# Patient Record
Sex: Female | Born: 1970 | Race: Black or African American | Hispanic: No | Marital: Single | Smoking: Never smoker
Health system: Southern US, Community
[De-identification: ages and names within clinical notes are randomized; demographics above are authoritative.]

## PROBLEM LIST (undated history)

## (undated) DIAGNOSIS — O1490 Unspecified pre-eclampsia, unspecified trimester: Secondary | ICD-10-CM

## (undated) DIAGNOSIS — L509 Urticaria, unspecified: Secondary | ICD-10-CM

## (undated) DIAGNOSIS — K219 Gastro-esophageal reflux disease without esophagitis: Secondary | ICD-10-CM

## (undated) DIAGNOSIS — L309 Dermatitis, unspecified: Secondary | ICD-10-CM

## (undated) DIAGNOSIS — I10 Essential (primary) hypertension: Secondary | ICD-10-CM

## (undated) DIAGNOSIS — M1990 Unspecified osteoarthritis, unspecified site: Secondary | ICD-10-CM

## (undated) DIAGNOSIS — E7800 Pure hypercholesterolemia, unspecified: Secondary | ICD-10-CM

## (undated) DIAGNOSIS — D219 Benign neoplasm of connective and other soft tissue, unspecified: Secondary | ICD-10-CM

## (undated) DIAGNOSIS — R51 Headache: Secondary | ICD-10-CM

## (undated) HISTORY — PX: OTHER SURGICAL HISTORY: SHX169

## (undated) HISTORY — DX: Urticaria, unspecified: L50.9

## (undated) HISTORY — PX: WISDOM TOOTH EXTRACTION: SHX21

## (undated) HISTORY — DX: Dermatitis, unspecified: L30.9

---

## 2000-09-04 ENCOUNTER — Encounter: Admission: RE | Admit: 2000-09-04 | Discharge: 2000-09-25 | Payer: Self-pay | Admitting: Orthopedic Surgery

## 2000-11-04 ENCOUNTER — Encounter: Admission: RE | Admit: 2000-11-04 | Discharge: 2001-02-02 | Payer: Self-pay | Admitting: Anesthesiology

## 2000-12-24 ENCOUNTER — Emergency Department (HOSPITAL_COMMUNITY): Admission: EM | Admit: 2000-12-24 | Discharge: 2000-12-24 | Payer: Self-pay | Admitting: *Deleted

## 2001-04-23 ENCOUNTER — Encounter: Payer: Self-pay | Admitting: Emergency Medicine

## 2001-04-23 ENCOUNTER — Emergency Department (HOSPITAL_COMMUNITY): Admission: EM | Admit: 2001-04-23 | Discharge: 2001-04-23 | Payer: Self-pay | Admitting: Emergency Medicine

## 2002-08-26 ENCOUNTER — Encounter: Payer: Self-pay | Admitting: Physical Medicine and Rehabilitation

## 2002-08-26 ENCOUNTER — Encounter
Admission: RE | Admit: 2002-08-26 | Discharge: 2002-08-26 | Payer: Self-pay | Admitting: Physical Medicine and Rehabilitation

## 2003-03-24 ENCOUNTER — Encounter (INDEPENDENT_AMBULATORY_CARE_PROVIDER_SITE_OTHER): Payer: Self-pay | Admitting: Specialist

## 2003-03-24 ENCOUNTER — Encounter: Payer: Self-pay | Admitting: Orthopaedic Surgery

## 2003-03-24 ENCOUNTER — Ambulatory Visit (HOSPITAL_COMMUNITY): Admission: RE | Admit: 2003-03-24 | Discharge: 2003-03-24 | Payer: Self-pay | Admitting: Orthopaedic Surgery

## 2003-08-05 ENCOUNTER — Inpatient Hospital Stay (HOSPITAL_COMMUNITY): Admission: RE | Admit: 2003-08-05 | Discharge: 2003-08-08 | Payer: Self-pay | Admitting: Orthopaedic Surgery

## 2003-10-01 ENCOUNTER — Emergency Department (HOSPITAL_COMMUNITY): Admission: EM | Admit: 2003-10-01 | Discharge: 2003-10-01 | Payer: Self-pay | Admitting: Emergency Medicine

## 2004-04-22 ENCOUNTER — Emergency Department (HOSPITAL_COMMUNITY): Admission: EM | Admit: 2004-04-22 | Discharge: 2004-04-23 | Payer: Self-pay | Admitting: Emergency Medicine

## 2004-08-20 HISTORY — PX: SPINAL FUSION: SHX223

## 2008-03-15 ENCOUNTER — Emergency Department (HOSPITAL_COMMUNITY): Admission: EM | Admit: 2008-03-15 | Discharge: 2008-03-15 | Payer: Self-pay | Admitting: Emergency Medicine

## 2008-10-05 ENCOUNTER — Emergency Department (HOSPITAL_COMMUNITY): Admission: EM | Admit: 2008-10-05 | Discharge: 2008-10-05 | Payer: Self-pay | Admitting: Emergency Medicine

## 2009-11-09 ENCOUNTER — Emergency Department (HOSPITAL_COMMUNITY): Admission: EM | Admit: 2009-11-09 | Discharge: 2009-11-09 | Payer: Self-pay | Admitting: Family Medicine

## 2009-12-06 ENCOUNTER — Inpatient Hospital Stay (HOSPITAL_COMMUNITY): Admission: AD | Admit: 2009-12-06 | Discharge: 2009-12-07 | Payer: Self-pay | Admitting: Obstetrics & Gynecology

## 2009-12-10 ENCOUNTER — Inpatient Hospital Stay (HOSPITAL_COMMUNITY): Admission: AD | Admit: 2009-12-10 | Discharge: 2009-12-11 | Payer: Self-pay | Admitting: Obstetrics and Gynecology

## 2009-12-12 ENCOUNTER — Ambulatory Visit: Payer: Self-pay | Admitting: Physician Assistant

## 2009-12-12 ENCOUNTER — Inpatient Hospital Stay (HOSPITAL_COMMUNITY): Admission: AD | Admit: 2009-12-12 | Discharge: 2009-12-12 | Payer: Self-pay | Admitting: Obstetrics & Gynecology

## 2009-12-19 ENCOUNTER — Inpatient Hospital Stay (HOSPITAL_COMMUNITY): Admission: AD | Admit: 2009-12-19 | Discharge: 2009-12-19 | Payer: Self-pay | Admitting: Obstetrics & Gynecology

## 2010-04-27 ENCOUNTER — Inpatient Hospital Stay (HOSPITAL_COMMUNITY)
Admission: AD | Admit: 2010-04-27 | Discharge: 2010-05-06 | Payer: Self-pay | Source: Home / Self Care | Admitting: Obstetrics and Gynecology

## 2010-04-29 ENCOUNTER — Encounter (INDEPENDENT_AMBULATORY_CARE_PROVIDER_SITE_OTHER): Payer: Self-pay | Admitting: Obstetrics and Gynecology

## 2010-05-26 ENCOUNTER — Inpatient Hospital Stay (HOSPITAL_COMMUNITY): Admission: AD | Admit: 2010-05-26 | Discharge: 2010-05-27 | Payer: Self-pay | Admitting: Obstetrics and Gynecology

## 2010-08-03 ENCOUNTER — Inpatient Hospital Stay (HOSPITAL_COMMUNITY)
Admission: AD | Admit: 2010-08-03 | Discharge: 2010-08-03 | Payer: Self-pay | Source: Home / Self Care | Attending: Obstetrics and Gynecology | Admitting: Obstetrics and Gynecology

## 2010-10-30 LAB — URINALYSIS, ROUTINE W REFLEX MICROSCOPIC
Ketones, ur: 15 mg/dL — AB
Leukocytes, UA: NEGATIVE
Nitrite: NEGATIVE
Protein, ur: NEGATIVE mg/dL
Urobilinogen, UA: 1 mg/dL (ref 0.0–1.0)
pH: 6 (ref 5.0–8.0)

## 2010-11-02 LAB — COMPREHENSIVE METABOLIC PANEL
ALT: 10 U/L (ref 0–35)
ALT: 8 U/L (ref 0–35)
ALT: 9 U/L (ref 0–35)
AST: 121 U/L — ABNORMAL HIGH (ref 0–37)
AST: 148 U/L — ABNORMAL HIGH (ref 0–37)
AST: 21 U/L (ref 0–37)
AST: 22 U/L (ref 0–37)
AST: 35 U/L (ref 0–37)
Albumin: 2 g/dL — ABNORMAL LOW (ref 3.5–5.2)
Albumin: 1.8 g/dL — ABNORMAL LOW (ref 3.5–5.2)
Albumin: 2 g/dL — ABNORMAL LOW (ref 3.5–5.2)
Albumin: 2.3 g/dL — ABNORMAL LOW (ref 3.5–5.2)
Albumin: 2.4 g/dL — ABNORMAL LOW (ref 3.5–5.2)
Alkaline Phosphatase: 89 U/L (ref 39–117)
Alkaline Phosphatase: 93 U/L (ref 39–117)
Alkaline Phosphatase: 94 U/L (ref 39–117)
Alkaline Phosphatase: 98 U/L (ref 39–117)
BUN: 8 mg/dL (ref 6–23)
BUN: 9 mg/dL (ref 6–23)
CO2: 20 mEq/L (ref 19–32)
CO2: 20 mEq/L (ref 19–32)
CO2: 24 mEq/L (ref 19–32)
CO2: 26 mEq/L (ref 19–32)
Calcium: 6.3 mg/dL — CL (ref 8.4–10.5)
Calcium: 7.5 mg/dL — ABNORMAL LOW (ref 8.4–10.5)
Calcium: 8.2 mg/dL — ABNORMAL LOW (ref 8.4–10.5)
Calcium: 8.3 mg/dL — ABNORMAL LOW (ref 8.4–10.5)
Chloride: 106 mEq/L (ref 96–112)
Chloride: 102 mEq/L (ref 96–112)
Chloride: 105 mEq/L (ref 96–112)
Chloride: 107 mEq/L (ref 96–112)
Chloride: 107 mEq/L (ref 96–112)
Chloride: 111 mEq/L (ref 96–112)
Creatinine, Ser: 0.61 mg/dL (ref 0.4–1.2)
Creatinine, Ser: 0.65 mg/dL (ref 0.4–1.2)
Creatinine, Ser: 0.67 mg/dL (ref 0.4–1.2)
Creatinine, Ser: 0.89 mg/dL (ref 0.4–1.2)
GFR calc Af Amer: >60 mL/min (ref >60)
GFR calc Af Amer: >60 mL/min (ref >60)
GFR calc Af Amer: >60 mL/min (ref >60)
GFR calc Af Amer: >60 mL/min (ref >60)
GFR calc Af Amer: >60 mL/min (ref >60)
GFR calc non Af Amer: >60 mL/min (ref >60)
GFR calc non Af Amer: >60 mL/min (ref >60)
GFR calc non Af Amer: >60 mL/min (ref >60)
GFR calc non Af Amer: >60 mL/min (ref >60)
Glucose, Bld: 73 mg/dL (ref 70–99)
Glucose, Bld: 81 mg/dL (ref 70–99)
Glucose, Bld: 87 mg/dL (ref 70–99)
Potassium: 3.9 mEq/L (ref 3.5–5.1)
Potassium: 4 mEq/L (ref 3.5–5.1)
Potassium: 4.3 mEq/L (ref 3.5–5.1)
Sodium: 135 mEq/L (ref 135–145)
Sodium: 136 mEq/L (ref 135–145)
Sodium: 136 mEq/L (ref 135–145)
Total Bilirubin: 0.6 mg/dL (ref 0.3–1.2)
Total Bilirubin: 0.1 mg/dL — ABNORMAL LOW (ref 0.3–1.2)
Total Bilirubin: 0.2 mg/dL — ABNORMAL LOW (ref 0.3–1.2)
Total Bilirubin: 0.3 mg/dL (ref 0.3–1.2)
Total Bilirubin: 0.4 mg/dL (ref 0.3–1.2)
Total Bilirubin: 0.5 mg/dL (ref 0.3–1.2)
Total Protein: 5 g/dL — ABNORMAL LOW (ref 6.0–8.3)
Total Protein: 4.8 g/dL — ABNORMAL LOW (ref 6.0–8.3)
Total Protein: 5.1 g/dL — ABNORMAL LOW (ref 6.0–8.3)
Total Protein: 5.9 g/dL — ABNORMAL LOW (ref 6.0–8.3)

## 2010-11-02 LAB — URINALYSIS, MICROSCOPIC ONLY
Glucose, UA: NEGATIVE mg/dL
Ketones, ur: NEGATIVE mg/dL
Leukocytes, UA: NEGATIVE
Nitrite: NEGATIVE
Specific Gravity, Urine: 1.02 (ref 1.005–1.030)
pH: 7 (ref 5.0–8.0)

## 2010-11-02 LAB — CBC
HCT: 25.7 % — ABNORMAL LOW (ref 36.0–46.0)
HCT: 26.5 % — ABNORMAL LOW (ref 36.0–46.0)
HCT: 26.5 % — ABNORMAL LOW (ref 36.0–46.0)
HCT: 30.5 % — ABNORMAL LOW (ref 36.0–46.0)
Hemoglobin: 10.1 g/dL — ABNORMAL LOW (ref 12.0–15.0)
Hemoglobin: 8.6 g/dL — ABNORMAL LOW (ref 12.0–15.0)
Hemoglobin: 8.8 g/dL — ABNORMAL LOW (ref 12.0–15.0)
Hemoglobin: 8.9 g/dL — ABNORMAL LOW (ref 12.0–15.0)
MCH: 29.8 pg (ref 26.0–34.0)
MCH: 30 pg (ref 26.0–34.0)
MCH: 30.4 pg (ref 26.0–34.0)
MCH: 30.5 pg (ref 26.0–34.0)
MCH: 30.9 pg (ref 26.0–34.0)
MCHC: 32.4 g/dL (ref 30.0–36.0)
MCHC: 32.4 g/dL (ref 30.0–36.0)
MCHC: 33 g/dL (ref 30.0–36.0)
MCHC: 33.5 g/dL (ref 30.0–36.0)
MCHC: 33.8 g/dL (ref 30.0–36.0)
MCV: 91.2 fL (ref 78.0–100.0)
MCV: 90.8 fL (ref 78.0–100.0)
MCV: 92.1 fL (ref 78.0–100.0)
Platelets: 328 10*3/uL (ref 150–400)
Platelets: 159 10*3/uL (ref 150–400)
Platelets: 177 10*3/uL (ref 150–400)
Platelets: 201 10*3/uL (ref 150–400)
Platelets: 210 10*3/uL (ref 150–400)
RBC: 2.79 MIL/uL — ABNORMAL LOW (ref 3.87–5.11)
RBC: 2.85 MIL/uL — ABNORMAL LOW (ref 3.87–5.11)
RBC: 2.88 MIL/uL — ABNORMAL LOW (ref 3.87–5.11)
RBC: 3.36 MIL/uL — ABNORMAL LOW (ref 3.87–5.11)
RBC: 3.38 MIL/uL — ABNORMAL LOW (ref 3.87–5.11)
RDW: 13.4 % (ref 11.5–15.5)
RDW: 12.6 % (ref 11.5–15.5)
RDW: 12.6 % (ref 11.5–15.5)
RDW: 12.7 % (ref 11.5–15.5)
WBC: 7.8 10*3/uL (ref 4.0–10.5)
WBC: 16.4 10*3/uL — ABNORMAL HIGH (ref 4.0–10.5)
WBC: 7.4 10*3/uL (ref 4.0–10.5)
WBC: 8.9 10*3/uL (ref 4.0–10.5)

## 2010-11-02 LAB — URINALYSIS, ROUTINE W REFLEX MICROSCOPIC
Glucose, UA: NEGATIVE mg/dL
Glucose, UA: NEGATIVE mg/dL
Ketones, ur: 15 mg/dL — AB
Protein, ur: >300 mg/dL — AB
Specific Gravity, Urine: 1.025 (ref 1.005–1.030)
Urobilinogen, UA: 1 mg/dL (ref 0.0–1.0)
pH: 6 (ref 5.0–8.0)

## 2010-11-02 LAB — PROTEIN, URINE, 24 HOUR
Collection Interval-UPROT: 24 hours
Protein, Urine: 163 mg/dL
Urine Total Volume-UPROT: 2050 mL

## 2010-11-02 LAB — URINE MICROSCOPIC-ADD ON: WBC, UA: NONE SEEN WBC/hpf (ref <3)

## 2010-11-02 LAB — CREATININE CLEARANCE, URINE, 24 HOUR
Creatinine, 24H Ur: 1027 mg/d (ref 700–1800)
Creatinine: 0.71 mg/dL (ref 0.4–1.2)
Urine Total Volume-CRCL: 2050 mL

## 2010-11-02 LAB — URIC ACID
Uric Acid, Serum: 6.1 mg/dL (ref 2.4–7.0)
Uric Acid, Serum: 6.2 mg/dL (ref 2.4–7.0)

## 2010-11-02 LAB — MRSA PCR SCREENING: MRSA by PCR: POSITIVE — AB

## 2010-11-02 LAB — URINE CULTURE: Culture  Setup Time: 201109090059

## 2010-11-02 LAB — AST: AST: 80 U/L — ABNORMAL HIGH (ref 0–37)

## 2010-11-02 LAB — LACTATE DEHYDROGENASE
LDH: 180 U/L (ref 94–250)
LDH: 265 U/L — ABNORMAL HIGH (ref 94–250)
LDH: 410 U/L — ABNORMAL HIGH (ref 94–250)

## 2010-11-02 LAB — MAGNESIUM
Magnesium: 11.4 mg/dL (ref 1.5–2.5)
Magnesium: 6.4 mg/dL (ref 1.5–2.5)
Magnesium: 7.2 mg/dL (ref 1.5–2.5)

## 2010-11-02 LAB — DIFFERENTIAL
Basophils Absolute: 0 10*3/uL (ref 0.0–0.1)
Basophils Relative: 0 % (ref 0–1)
Eosinophils Relative: 2 % (ref 0–5)
Lymphocytes Relative: 23 % (ref 12–46)
Neutro Abs: 5.5 10*3/uL (ref 1.7–7.7)

## 2010-11-02 LAB — RPR: RPR Ser Ql: NONREACTIVE

## 2010-11-07 LAB — URINE MICROSCOPIC-ADD ON: WBC, UA: NONE SEEN WBC/hpf (ref <3)

## 2010-11-07 LAB — URINALYSIS, ROUTINE W REFLEX MICROSCOPIC
Bilirubin Urine: NEGATIVE
Bilirubin Urine: NEGATIVE
Glucose, UA: NEGATIVE mg/dL
Hgb urine dipstick: NEGATIVE
Ketones, ur: >80 mg/dL — AB
Ketones, ur: 40 mg/dL — AB
Ketones, ur: >80 mg/dL — AB
Ketones, ur: NEGATIVE mg/dL
Leukocytes, UA: NEGATIVE
Nitrite: NEGATIVE
Protein, ur: 30 mg/dL — AB
Protein, ur: NEGATIVE mg/dL
Specific Gravity, Urine: >1.03 — ABNORMAL HIGH (ref 1.005–1.030)
Urobilinogen, UA: 0.2 mg/dL (ref 0.0–1.0)
Urobilinogen, UA: 1 mg/dL (ref 0.0–1.0)
pH: 6 (ref 5.0–8.0)
pH: 6 (ref 5.0–8.0)

## 2010-11-07 LAB — COMPREHENSIVE METABOLIC PANEL
ALT: 12 U/L (ref 0–35)
ALT: 13 U/L (ref 0–35)
AST: 19 U/L (ref 0–37)
Albumin: 3.7 g/dL (ref 3.5–5.2)
CO2: 24 mEq/L (ref 19–32)
Calcium: 9.5 mg/dL (ref 8.4–10.5)
Calcium: 9.5 mg/dL (ref 8.4–10.5)
Chloride: 105 mEq/L (ref 96–112)
Creatinine, Ser: 0.64 mg/dL (ref 0.4–1.2)
GFR calc Af Amer: >60 mL/min (ref >60)
GFR calc non Af Amer: >60 mL/min (ref >60)
Glucose, Bld: 85 mg/dL (ref 70–99)
Sodium: 134 mEq/L — ABNORMAL LOW (ref 135–145)
Sodium: 136 mEq/L (ref 135–145)
Total Bilirubin: 0.6 mg/dL (ref 0.3–1.2)

## 2010-11-07 LAB — CBC
HCT: 38.9 % (ref 36.0–46.0)
Hemoglobin: 12.6 g/dL (ref 12.0–15.0)
MCHC: 32.4 g/dL (ref 30.0–36.0)
MCV: 89.5 fL (ref 78.0–100.0)
MCV: 89.6 fL (ref 78.0–100.0)
Platelets: 240 10*3/uL (ref 150–400)
RBC: 4.08 MIL/uL (ref 3.87–5.11)
RBC: 4.34 MIL/uL (ref 3.87–5.11)
WBC: 7.3 10*3/uL (ref 4.0–10.5)

## 2010-11-07 LAB — HCG, QUANTITATIVE, PREGNANCY: hCG, Beta Chain, Quant, S: 260699 m[IU]/mL — ABNORMAL HIGH (ref <5)

## 2010-11-07 LAB — WET PREP, GENITAL: Trich, Wet Prep: NONE SEEN

## 2010-12-24 ENCOUNTER — Ambulatory Visit (INDEPENDENT_AMBULATORY_CARE_PROVIDER_SITE_OTHER): Payer: Medicaid Other

## 2010-12-24 ENCOUNTER — Inpatient Hospital Stay (INDEPENDENT_AMBULATORY_CARE_PROVIDER_SITE_OTHER)
Admission: RE | Admit: 2010-12-24 | Discharge: 2010-12-24 | Disposition: A | Discharge status: Home / Self Care | Payer: Medicaid Other | Source: Ambulatory Visit | Attending: Family Medicine | Admitting: Family Medicine

## 2010-12-24 DIAGNOSIS — M79609 Pain in unspecified limb: Secondary | ICD-10-CM

## 2010-12-24 DIAGNOSIS — T148XXA Other injury of unspecified body region, initial encounter: Secondary | ICD-10-CM

## 2011-01-05 NOTE — H&P (Signed)
[name], [name]                           ACCOUNT NO.:  [id number]   MEDICAL RECORD NO.:  [id number]                   PATIENT TYPE:  OIB   LOCATION:  NA                                   FACILITY:  [facility]   PHYSICIAN:  Sharolyn Douglas, M.D.                     DATE OF BIRTH:  1970/10/25   DATE OF ADMISSION:  03/24/2003  DATE OF DISCHARGE:                                HISTORY & PHYSICAL   CHIEF COMPLAINT:  Back and right lower extremity pain.   HISTORY OF PRESENT ILLNESS:  The patient is a 40 year old female who has  been having back and right lower extremity pain that has been disabling and  eliminating her abilities to function.  She has tried conservative  treatments; unfortunately, has not gotten any relief.  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  The risks and  benefits of the proposed surgery were discussed with the patient by Dr. Sharolyn Douglas as well as myself; she indicated understanding and opted to proceed.   ALLERGIES:  No known drug allergies.   MEDICATIONS:  1. Celebrex daily.  2. Ibuprofen, which she has stopped taking.  3. Skelaxin p.r.n.  4. Allegra 180 mg daily.  5. Rhinocort nasal spray p.r.n.   PAST MEDICAL HISTORY:  Significant for:  1. Asthma.  2. Seasonal allergies.  3. Degenerative disk disease.   PAST SURGICAL HISTORY:  Oral surgery only.   SOCIAL HISTORY:  She denies tobacco use, drinks alcohol on a social basis.  She is single, has no children.  She lives alone.   FAMILY HISTORY:  Family medical history is noncontributory.   REVIEW OF SYSTEMS:  GENERAL:  The patient denies any fevers, chills, sweats,  or bleeding tendencies.  CNS:  Denies blurred vision, double vision,  seizure, headache,  or paralysis.  CARDIOVASCULAR:  No chest pain, angina,  orthopnea, claudication, or palpitations.  PULMONARY:  Denies shortness of  breath, productive cough, or hemoptysis.  GI:  Denies nausea, vomiting,  constipation, diarrhea, melena, or bloody stools.  GU:  Denies dysuria,  hematuria, or discharge.  MUSCULOSKELETAL:  As per HPI.   PHYSICAL EXAMINATION:  VITAL SIGNS:  Blood pressure 120/82, respirations 16  and unlabored, pulse is 78 and regular.  GENERAL APPEARANCE:  The patient is a 40 year old female who is alert and  oriented and in no acute distress.  She is well nourished, well groomed,  appears her stated age, pleasant and cooperative to exam.  HEENT:  Head is normocephalic, atraumatic.  Pupils are equal, round, and  reactive to light.  Extraocular movements are intact.  Nares are patent.  Pharynx is clear.  NECK:  Soft to palpation.  No lymphadenopathy, thyromegaly, or bruits are  appreciated.  CHEST:  Clear to auscultation bilaterally.  No rales, rhonchi, stridor,  wheezes, or friction rubs.  BREASTS:  Not pertinent and not performed.  HEART:  S1 and S2.  Regular rate and rhythm.  No murmurs, gallops, or rubs  are noted.  ABDOMEN:  Soft to palpation, nontender, nondistended.  No organomegaly is  noted.  Positive bowel sounds throughout.  GU:  Not pertinent and not performed.  EXTREMITIES:  Patient has right lower extremity pain.  Motor function and  sensation are grossly intact.  Pulses are intact and symmetric bilaterally.  SKIN:  Intact without any lesions or rashes.   LABORATORY DATA:  X-ray and MRI showed degenerative disk disease and HNP at  L5-S1.   IMPRESSION:  1. Herniated nucleus pulposus, L5-S1.  2. Degenerative disk disease.  3. Asthma.   PLAN:  Admit to [facility] on March 24, 2003, for percutaneous  diskectomy at L5-S1; this will be done by Dr. Sharolyn Douglas.      Verlin Fester, P.A.                       Sharolyn Douglas, M.D.    CM/MEDQ  D:   03/19/2003  T:  03/19/2003  Job:  [id number]

## 2011-01-05 NOTE — H&P (Signed)
[facility]  Patient:    [name], [name]                        MRN: [id number] Adm. Date:  [id number] Attending:  Thyra Breed CC:         Harvie Junior, M.D.  Lillia Dallas. Murray Hodgkins, M.D.   History and Physical  FOLLOW-UP EVALUATION:  [name] comes in for a follow-up evaluation today. The patient has had two lumbar epidural steroid injections with no improvement.  She saw Dr. Luiz Blare last week, and he has recommended that she see Dr. Murray Hodgkins.  She continues to have low back pain which radiates predominantly out to the right lower extremity with extension in the right buttock and the posterior aspect of the right lower extremity.  Her symptoms are unchanged, and she continues to have the same symptoms she presented with. She rates her pain at about 8 out of 10.  Her medications are Celebrex.  She denies any new numbness or tingling, new bowel or bladder problems, or new weakness.  PHYSICAL EXAMINATION:  VITAL SIGNS:  Blood pressure 120/82, heart rate 72, respiratory rate 20, O2 saturation 100%, pain level is 8 out of 10.  MUSCULOSKELETAL/NEUROLOGIC:  Straight leg raise signs are negative.  Deep tendon reflexes were symmetric at the knees and ankles.  Motor was 5/5.  IMPRESSION:  Low back pain syndrome with underlying degenerative disk disease at L5-S1 with herniated disk to the right.  DISPOSITION:  Increase the patients fluid and follow up with Dr. Murray Hodgkins and if his interventions are unsuccessful, she may need to consider surgical intervention. DD:  12/11/00 TD:  12/11/00 Job: [id number]

## 2011-01-05 NOTE — Procedures (Signed)
[facility]  Patient:    [name], [name]                        MRN: [id number] Proc. Date: 11/20/00 Adm. Date:  [id number] Attending:  Thyra Breed CC:         Harvie Junior, M.D.             Workmans Compensation                           Procedure Report  PROCEDURE:  Lumbar epidural steroid injection.  ANESTHESIOLOGIST:  Thyra Breed, M.D.  DIAGNOSIS:  Herniated disk at L5-S1.  HISTORY:  The patient is a 40 year old who is sent to [place] by Dr. Harvie Junior for epidural steroid injections.  The patient relates a history of an injury which occurred March 25, 2000.  She was initially evaluated at [facility] and treated conservatively with physical therapy with no lasting improvement.  She was later evaluated by Dr. Loraine Leriche C. Ophelia Charter, who felt like she should return to work.  She got another opinion from another physician that she does not recall, who treated her with a steroid dosepak which she stated temporarily helped her back discomfort. She was unable to get an MRI through that physician and was seen by Dr. Luiz Blare for evaluation initially on May 31, 2000.  At that time, the patient was given conservative management with physical therapy and Celebrex with Skelaxin.  She then got an FCE at that time.  She was seen back in followup and her medications adjusted.  Because her pain was persisting into her right buttock and down the posterior aspect of her right lower extremity, she underwent an MRI which was performed on October 02, 2000 and interpreted by ______ as showing degenerative disk disease at L5-S1 with a protrusion and slight posterior displacement of the S1 nerve root due to a herniated disk. She was seen back by Dr. Luiz Blare who had Dr. Katrinka Blazing do what sounds like a selective right S1 nerve root injection, which the patient stated exacerbated her pain.  She presents today for epidural steroid injections.  She describes her pain  as really a combination of several types of pain described as achy to sharp pins-and-needles-type discomfort.  It is made worse by standing or activities and improved by sitting and rest.  She initially described distribution into the anterior thighs and anterior legs, with some numbness and tingling over the soles of the feet.  It also affects her lower back to an extent.  She described nonfocal weakness of the lower extremities and no loss of control of her bowel or bladder.  She has not noted that the treatments with Celebrex and Skelaxin have resulted in a great deal of improvement, nor any sustained improvement from physical therapy.  CURRENT MEDICATIONS:  Skelaxin and Celebrex.  ALLERGIES:  None.  FAMILY HISTORY:  Family history is positive for diabetes, glaucoma, cancer, thyroid disease, allergic diathesis including asthma and rhinitis, history of renal failure with underlying hypertension and peptic ulcer disease and seizures.  ACTIVE MEDICAL PROBLEMS:  The patient has recurrent allergic problems, predominantly sinus problems and associated headaches.  PAST SURGICAL HISTORY:  Past surgical history is significant for wisdom tooth extraction.  SOCIAL HISTORY:  The patient rarely drinks alcohol.  She does not smoke.  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  REVIEW OF SYSTEMS:  GENERAL:  Negative.  HEAD:  Significant for headaches from her sinuses.  EYES:  Negative.  NOSE, MOUTH AND THROAT:  Recurrent allergic rhinitis.  EARS:  Negative.  PULMONARY:  Negative.  CARDIOVASCULAR:  Negative. GI:  History of some mild indigestion at times and constipation.  GU: Negative.  MUSCULOSKELETAL:  See HPI.  She also complains of some right shoulder discomfort and has had some physical therapy and injections for this. NEUROLOGIC:  See HPI for pertinent positives.  No history of seizure or stroke.  HEMATOLOGIC:  Negative.  CUTANEOUS:  Significant  for what sounds like eczematous-type eruptions related to her allergies.  ENDOCRINE:  Negative. PSYCHIATRIC:  Negative.  ALLERGY/IMMUNOLOGIC:  She states she is allergic to pollens as well as animal danders.  EXAMINATION:  VITAL SIGNS:  Blood pressure is 108/62, heart rate is 96, respiratory rate is 20, O2 saturation is 97%, pain level is 7/10 and temperature is 97.3.  GENERAL:  This is a small, thin female in no acute distress.  HEENT:  Head was normocephalic, atraumatic.  Eyes:  Extraocular movements intact, with conjunctivae and sclerae clear.  Nose:  Patent nares.  Oropharynx was free of lesions with some shallow torus palatinus.  NECK:  Neck demonstrated lymphadenopathy of the left-sided posterior cervical chain with good range of motion of the neck.  Carotids are 2+ and symmetric without bruits.  LUNGS:  Lungs are clear.  HEART:  Heart was regular rate and rhythm.  BREASTS, ABDOMINAL, PELVIC AND RECTAL:  Exams were not performed.  BACK:  Exam revealed mild scoliosis to the right which looked more spasmodic.  EXTREMITIES:  No cyanosis, clubbing, nor edema, with radial pulses and dorsalis pedis pulses 2+ and symmetric.  NEUROLOGIC:  The patient was oriented x 4.  Cranial nerves II-XII were grossly intact.  Deep tendon reflexes were symmetric in the upper and lower extremities with downgoing toes.  Motor was 5/5 with symmetric bulk and tone. Coordination was grossly intact.  Sensory was intact to pinprick and vibratory sense.  IMPRESSION: 1. Low back pain syndrome with history of trauma back in August of 2001, with    MRI showing degenerative disk disease at L5-S1 and herniated disk to the    right. 2. Other medical problems per primary care physician which include allergic    rhinitis with recurrent headaches.  DISPOSITION:  I discussed the potential risks, benefits and limitations of a lumbar epidural steroid injection in detail with the patient, as well as reviewed  the side-effects of corticosteroids.  Her questions were answered.  She wishes to proceed.  DESCRIPTION OF PROCEDURE:  After informed consent was obtained, the patient was placed in a sitting position and monitored.  Her back was prepped with Betadine x 3.  A skin wheal was raised at the L5-S1 interspace with 1% lidocaine.  A 20-gauge Tuohy needle was introduced to the lumbar epidural space to loss of resistance to preservative-free normal saline.  There was no CSF nor blood.  The depth was 3.5 cm.  I injected Medrol 40 mg in 6 ml of preservative-free normal saline.  The needle was flushed with preservative-free normal saline and removed intact.  POST-PROCEDURAL CONDITION:  Stable.  DISCHARGE INSTRUCTIONS: 1. Resume previous diet. 2. Limitation in activities per instruction sheet. 3. Continue on current medications. 4. Follow up with me in one week for a repeat injection.  The patient is    concerned about getting more than one injection, as she recently got an  injection by ______ and I advised her that we would just need to see how    she did with the injections to determine whether she needed further    injection. DD:  11/20/00 TD:  11/20/00 Job: [id number]

## 2011-01-05 NOTE — Op Note (Signed)
NAMEGWENEVERE, GOGA                           ACCOUNT NO.:  [id number]   MEDICAL RECORD NO.:  [id number]                   PATIENT TYPE:  INP   LOCATION:  5037                                 FACILITY:  [facility]   PHYSICIAN:  Sharolyn Douglas, M.D.                     DATE OF BIRTH:  October 21, 1970   DATE OF PROCEDURE:  08/05/2003  DATE OF DISCHARGE:                                 OPERATIVE REPORT   PREOPERATIVE DIAGNOSIS:  Degenerative disc disease L5-S1 with herniated  nucleus pulposus.   POSTOPERATIVE DIAGNOSIS:  Degenerative disc disease L5-S1 with herniated  nucleus pulposus.   PROCEDURE:  1. L5-S1 transforaminal lumbar interbody fusion with placement of a 10 mm     PEEK prosthetic cage packed with BMP and local autogenous bone graft.  2. Posterior spinal arthrodesis L5-S1.  3. Pedicle screw instrumentation, L5-S1, utilizing the [employer]     Encompass System.  4. Local autogenous bone graft supplemented with BMP.  5. Neural monitoring utilizing free running and triggered EMGs.   SURGEON:  Sharolyn Douglas, M.D.   ASSISTANT:  Verlin Fester, P.A.   ANESTHESIA:  General endotracheal anesthesia.   ESTIMATED BLOOD LOSS:  Minimal.   INDICATIONS FOR PROCEDURE:  The patient is a 40 year old female with a long  history of progressively worsening back and right leg pain.  She has had  various nonoperative treatment modalities including pain management,  injection, medications, and percutaneous discectomy at L5-S1.  Her plain  radiograph showed degenerative changes at L5-S1.  MRI scan shows disc  desiccation at L5-S1 with narrowing and HNP which is central and to the  right.  Because of her persistent symptoms refractory to all conservative  care, she has elected to undergo minimally invasive L5-S1 decompression and  fusion in hopes of improving her symptoms.  The risks, benefits, and  alternatives were discussed with the patient and she elected to proceed.   PROCEDURE:  The patient was  properly identified in the holding area, taken  to the operating room.  She underwent general endotracheal anesthesia  without difficulty.  She was given prophylactic IV antibiotics.  EMG leads  were placed for monitoring of triggered and free running EMGs utilizing the  NeuroVision System.  She was carefully turned prone onto the Wilson frame.  All bony prominences were padded.  The eyes were protected at all times.  The back was prepped and draped in the usual sterile fashion.  Fluoroscopy  was draped and brought into the field.  The lateral border of the L5 and S1  pedicles was marked bilaterally.  A 2.5 cm incision was made just lateral to  the pedicles bilaterally.  Dissection was carried sharply through the deep  fascia.  Starting on the right side, we used the dilators from the [facility]  retractor system to spread the paraspinal muscles.  The dilators were docked  onto the L5-S1  facet joint.  We then placed the Maxess retractor with the 50  mm blades.  The retractor was attached to the table.  We then expanded the  retractor exposing the facet joint on the right side.  We then performed a  facetectomy with the high speed bur and osteotomes.  The microscope was  draped and brought into the field.  The exiting L5 nerve root was  identified, gently mobilized medially.  We then identified the disc space.  Bleeding was controlled with bipolar electrocautery and Gelfoam.  The disc  space was incised.  We performed a radical discectomy across the  contralateral side using TLIF instruments.  We scraped the cartilaginous  endplates.  We dilated the disc space up to 10 mm.  We packed the disc space  with local autogenous bone graft obtained from the facet joint which was  cleaned and morselized.  We then placed a BMP sponge from the [employer] small  set into the disc space and pushed it anteriorly.  A 10 mm PEEK prosthetic  cage packed with half the second BMP sponge was then carefully impacted  into  the disc space and across the midline.  We confirmed good positioning with  AP and lateral fluoroscopy.   We then turned our attention to placing the pedicle screws.  Starting on the  right side, a Jamshidi needle was used to cannulate the pedicles.  We used  AP and lateral fluoroscopy as well as direct palpation of the L5 and S1  pedicles from within the spinal canal.  We placed guide-wires through the  Jamshidi needle.  We then placed cannulated pedicle screws over the guide-  wires.  A similar procedure was carried out on the left side, although we  were not able to palpate the pedicles from the canal.  We stimulated the  wires and tapped after each step and in each case, we had a response of less  than 14 milliamps consistent with interosseous placement.  We placed 6.5 by  40 mm screws at L5 and 6.5 by 35 mm screws on the sacrum.  We had good  purchase of the screws.  We stimulated the screws after they were placed  and, again, had a response of greater than 14 milliamps consistent with  interosseous placement of the screws.  We applied gentle compression across  the L5-S1 segment for locking the short segment titanium rods into position.  The locking caps were sheared off.   We then turned our attention to performing a posterior spinal arthrodesis on  the left side.  An osteotome was used to decorticate the L5-S1 facet joint.  We then packed the remaining BMP sponge and morselized local autogenous  graft over the facet joint.  The deep fascia was closed with an 0 Vicryl,  the subcutaneous layer closed with 2-0 Vicryl, followed by Dermabond.  The  patient was turned supine, extubated without difficulty, and transferred to  the recovery room in stable condition.  It should be noted there were no  changes in the free running EMGs throughout the TLIF procedure.                                               Sharolyn Douglas, M.D.   MC/MEDQ  D:  08/05/2003  T:  08/05/2003  Job:   [id number]

## 2011-01-05 NOTE — Op Note (Signed)
NAMEARDINE, IACOVELLI                           ACCOUNT NO.:  [id number]   MEDICAL RECORD NO.:  [id number]                   PATIENT TYPE:  OIB   LOCATION:  2550                                 FACILITY:  [facility]   PHYSICIAN:  Sharolyn Douglas, M.D.                     DATE OF BIRTH:  [birth year]   DATE OF PROCEDURE:  03/24/2003  DATE OF DISCHARGE:  03/24/2003                                 OPERATIVE REPORT   PREOPERATIVE DIAGNOSIS:  L5-S1 degenerative disk disease with herniated  nucleus pulposus and chronic back and leg pain.   POSTOPERATIVE DIAGNOSIS:  L5-S1 degenerative disk disease with herniated  nucleus pulposus and chronic back and leg pain.   OPERATION PERFORMED:  1. L5-S1 percutaneous diskectomy.  2. Radiologic interpretation of fluoroscopic images used for needle     localization.   SURGEON:  Sharolyn Douglas, M.D.   ASSISTANT:  None.   ANESTHESIA:  Conscious sedation plus local.   COMPLICATIONS:  None.   INDICATIONS FOR PROCEDURE:  The patient is a 40 year old female with a  several year history of disabling lower back and bilateral lower extremity  pain right greater than left.  She has failed all reasonable conservative  treatment options.  She has a large herniating disk at L5-S1 with concordant  pain at that level on diskography.  I had previously discussed with her the  possibility of L5-S1 fusion.  We also discussed percutaneous diskectomy.  At  this point she elected to try the percutaneous diskectomy in hopes of  improving her symptoms.  The risks, benefits and alternatives were  extensively reviewed with the patient and she elected to proceed.   DESCRIPTION OF PROCEDURE:  The patient was properly identified in the  holding area and taken to the operating room.  She underwent conscious  sedation by the anesthesia department.  She was carefully turned prone onto  the Muir table.  A bony prominences were padded.  She was given  prophylactic intravenous antibiotics.   The patient was prepped and draped in  the usual sterile fashion.  Fluoroscopy was brought into the field.  An  oblique image of L5-S1 was obtained.  2mL of 1% lidocaine with epinephrine  was used to anesthetize the skin and subcutaneous tissue.  A 16 gauge  introducer needle was then placed into the L5-S1 disk on the right side  using a posterolateral approach.  At no time did the patient experience any  leg pain.  A small amount of saline mixed with Ancef was injected into the  disk.  The patient experienced similar pain to her typical pain pattern.  The Stryker Dekompressor probe was then placed through the introducer needle  and turned on for approximately two minutes.  The probe was removed in and  out of the disk space under lateral fluoroscopy.  We collected a total of  2mL of disk material.  This was sent to pathology.  The Stryker Dekompressor  probe was then removed and 1mL of Ancef  solution was injected into the introducer needle as it was withdrawn from  the disk space.  A band-aid was placed over the small puncture hole.  The  patient was turned supine.  She was transferred to recovery room in stable  condition, able to remove all four extremities.                                                Sharolyn Douglas, M.D.    MC/MEDQ  D:  03/24/2003  T:  03/25/2003  Job:  [id number]

## 2011-01-05 NOTE — H&P (Signed)
[name], [name]                           ACCOUNT NO.:  [id number]   MEDICAL RECORD NO.:  [id number]                   PATIENT TYPE:  INP   LOCATION:  2860                                 FACILITY:  [facility]   PHYSICIAN:  Sharolyn Douglas, M.D.                     DATE OF BIRTH:  Sep 21, 1970   DATE OF ADMISSION:  08/05/2003  DATE OF DISCHARGE:                                HISTORY & PHYSICAL   CHIEF COMPLAINT:  Back and right lower extremity pain.   HISTORY OF PRESENT ILLNESS:  The patient is a 40 year old female who has  been having progressively worsening back and right lower extremity pain  secondary to a work injury dating back to August of 2001.  Unfortunately,  she has not seen any improvement with any conservative measures including  anti-inflammatories, narcotic pain medications, physical therapy, activity  modifications.  Pain is progressively getting worse, severe in nature, keeps  her up at night, limits her ability to work and interferes with activities  of daily living and quality of life .  She actually underwent a percutaneous  discectomy earlier this year and did not see any improvement from that  either.  Secondary to her severe pain and failure to improve as well as her  MRI and x-ray findings, it was felt that her best course of management would  be a posterior spinal fusion at L5-S1.  Risks and benefits of the procedure  were discussed with the patient by Dr. Noel Gerold as well as myself. She  indicated understanding and opted to proceed.   ALLERGIES:  No known drug allergies.   MEDICATIONS:  1. Claritin 10 mg daily.  2. Allegra 180 mg daily.  3. Nasacort two sprays daily.  4. Ibuprofen 800 mg three times a day.  5. Skelaxin p.r.n.   PAST MEDICAL HISTORY:  1. Migraine headaches.  2. Asthma.   PAST SURGICAL HISTORY:  Wisdom teeth extraction.   SOCIAL HISTORY:  The patient denies tobacco use.  She drinks alcohol on a  rare social bases.  She is single and works as  a Pharmacologist.  She  has her mother and friends in town to  help with her postoperative course.   FAMILY HISTORY:  Noncontributory.   REVIEW OF SYMPTOMS:  The patient denies fevers, chills, sweats or bleeding  tendencies.  CNS:  Denies blurry vision, double vision, seizures, headache,  paralysis.  CARDIOVASCULAR:  Denies chest pain, angina, orthopnea,  claudication or palpitation.  PULMONARY:  Denies shortness of breath,  productive cough or hemoptysis.  GI:  Denies nausea, vomiting, constipation,  diarrhea, melena or bloody stools.  GU:  Denies dysuria, hematuria or  discharge.  MUSCULOSKELETAL:  As per HPI.   PHYSICAL EXAMINATION:  GENERAL APPEARANCE:  The patient is a 40 year old  black female who is alert and oriented, in no acute distress.  She is well-  nourished,  well-groomed, appears stated age, pleasant and cooperative to  examination.  VITAL SIGNS:  Blood pressure 140/96, respiratory rate 16 and unlabored,  pulse 80 and regular.  HEENT:  Head is normocephalic and atraumatic.  Pupils are equal, round and  reactive.  Extraocular movements intact.  Nares patent.  Pharynx clear.  NECK:  Supple to palpation, no lymphadenopathy, thyromegaly.  No bruits  appreciated.  CHEST:  Clear to auscultation bilaterally.  No rales, rhonchi, stridor,  wheezes, or friction rubs.  BREASTS:  Not pertinent and not performed.  CARDIOVASCULAR:  S1 and S2 regular rate and rhythm with no murmurs, rubs, or  gallops noted.  ABDOMEN:  Soft to palpation.  Positive bowel sounds, nontender and  nondistended.  No organomegaly noted.  GU:  Not pertinent and not performed.  EXTREMITIES:  The patient has right lower extremity pain, motor and  sensation are grossly intact.  Pulses intact and symmetric.  Calves are soft  and nontender.  SKIN:  Intact without any lesions or rashes.   X-ray shows collapse of L5-S1 disc space.  MRI shows degenerative disc  disease at L5-S1 with an HNP at that level as  well.   IMPRESSION:  1. Degenerative disc disease with herniated nucleus pulposus at L5-S1.  2. Asthma.  3. History of migraines.   PLAN:  Admit to [facility] on August 05, 2003, for an  L5-S1 minimally invasive posterior spinal fusion.  This will be done by Dr.  Sharolyn Douglas.      Verlin Fester, P.A.                       Sharolyn Douglas, M.D.    CM/MEDQ  D:  08/05/2003  T:  08/05/2003  Job:  [id number]

## 2011-01-05 NOTE — Procedures (Signed)
[facility]  Patient:    [name], LAC                        MRN: [id number] Proc. Date: 12/02/00 Adm. Date:  [id number] Attending:  Thyra Breed CC:         Harvie Junior, M.D.  Workers Comp   Procedure Report  PROCEDURE PERFORMED:  Lumbar epidural steroid injection.  ANESTHESIOLOGIST:  Thyra Breed, M.D.  DIAGNOSIS:  Herniated nucleus pulposus at L5-S1.  INTERVAL HISTORY:  The patient has not noted a great deal of improvement after the first injection but wishes to proceed with another one today.  She notes no untoward effects.  PHYSICAL EXAMINATION:  Blood pressure 129/87, heart rate 103, respiratory rate 20, oxygen saturation 98%.  Her back shows good healing from her previous injection site.  DESCRIPTION OF PROCEDURE:  After informed consent was obtained, the patient was placed in sitting position and monitored.  The back was prepped with Betadine x 3.  A skin wheal was raised at the L4-5 interspace with 1% lidocaine.  A 20 gauge Tuohy needle was introduced into the lumbar epidural space to a loss of resistance to preservative free normal saline.  There was no CSF or blood.  The depth was 3.5 cm.  I injected 40 mg of Medrol in 6 ml of preservative free normal saline.  The needle was flushed with preservative free normal saline and removed intact.  Postprocedure condition was stable.  DISCHARGE INSTRUCTIONS: 1. Resume previous diet. 2. Limitations in activities per instruction sheet. 3. Continue on current medications. 4. The patient plans to see Dr. Luiz Blare later this week and she is scheduled    back next week for a third injection but I have advised her that she will    need to show signs that she is improving with this before proceeding. DD:  12/02/00 TD:  12/02/00 Job: [id number]

## 2011-01-05 NOTE — Discharge Summary (Signed)
NAMEJONESSA, TRIPLETT                           ACCOUNT NO.:  [id number]   MEDICAL RECORD NO.:  [id number]                   PATIENT TYPE:  INP   LOCATION:  5037                                 FACILITY:  [facility]   PHYSICIAN:  Sharolyn Douglas, M.D.                     DATE OF BIRTH:  [birth year]   DATE OF ADMISSION:  08/05/2003  DATE OF DISCHARGE:  08/08/2003                                 DISCHARGE SUMMARY   ADMISSION DIAGNOSES:  1. Lumbar degenerative disk disease.  2. Asthma.  3. History of migraines.   DISCHARGE DIAGNOSES:  1. Status post L5-S1 minimally invasive posterior spinal fusion.  2. Asthma.  3. History of migraines.   CONSULTS:  None.   PROCEDURES:  L5-S1 minimally invasive posterior spinal fusion with [place]  screws and TLIF at L5-S1.  Surgeon was Sharolyn Douglas, MD.  Assistant was [facility], PA-C.  General anesthesia.   LABORATORY DATA:  Labs preoperatively, CBC was within normal limits.  PT/INR, PTT within normal limits.  Complete metabolic panel within normal  limits with the exception of AST slightly elevated at 40.  UA was negative.  Blood typing was type B, Rh type positive, antibody screen negative.  UA  from December 13 was negative.   X-RAYS:  December 16 intraoperatively, C-arm was used for localization and  screw positioning.  December 19, postoperative changes at L5-S1 with  posterolateral interbody fusion.   BRIEF HISTORY:  The patient is a 40 year old female who has been having  progressively worsening back and right lower extremity pain secondary to a  work injury dating back to August of 2001. She denies any improvement with  years of conservative management including anti-inflammatories, narcotic  pain medications, physical therapy, activity modifications.  Pain is  progressively getting worse.  It is quite severe in nature, keeping her up  at night, limits her ability to work, and interferes with activities of  daily living, quality of life.  She had  undergone percutaneous diskectomy  earlier in the year and did not have any improvement from that.  She is  still having severe pain that is limiting her function, and she has  degenerative disk disease on MRI and x-ray.  It is felt that her best course  of management would be a posterior spinal fusion.  The risks and benefits of  this procedure were discussed with the patient by Dr. Noel Gerold.  She indicated  an understanding and opted to proceed.   HOSPITAL COURSE:  The patient was admitted to the hospital on August 05, 2003, for the above-listed procedure.  She tolerated the procedure well  without any intraoperative complications.  There was minimal blood loss  intraoperatively.  She was transferred to the recovery room in stable  condition.   Postoperatively routine orthopedic spine protocol was followed.  She  progressed very well.  Her primary difficulty was  with pain management.  We  did have her on initially  IV pain medicines as well as p.o. medicines.  She  was transitioned over to p.o. medicines strictly prior to discharge.  On  postoperative day 2 she was still having issues with pain management on p.o.  and she was also having some itching secondary to pain medicine.  Therefore,  she was started on Atarax which resolved her itching.  On postoperative day  3 the patient had met all orthopedic goals.  She was stable and ready for  discharge home medically.  [facility] was consulted for any home  needs.  Vital signs were stable.  Sensation was intact.  Incisions looked  good without any signs or symptoms of infection.   DISCHARGE PLAN:  The patient is a 40 year old female status post posterior  spinal fusion at L5-S1 doing well.  Activity is daily ambulation, brace on  when she is up.  Dressing changes daily to the back, keep incisions dry x5  days.  Back precautions were discussed and understood.  She is to avoid  lifting anything heavier than 5 pounds.  Follow up 2  weeks postoperatively  with Dr. Noel Gerold.  Diet is regular home diet.  Medications are Percocet,  Robaxin, calcium, multivitamin, and a laxative as needed.   CONDITION ON DISCHARGE:  Stable, improved.   DISPOSITION:  The patient is being discharged to her home with [facility] as well as family to assist her.      Verlin Fester, P.A.                       Sharolyn Douglas, M.D.    CM/MEDQ  D:  09/08/2003  T:  09/08/2003  Job:  [id number]

## 2011-08-29 ENCOUNTER — Other Ambulatory Visit: Payer: Self-pay | Admitting: Obstetrics and Gynecology

## 2011-09-03 ENCOUNTER — Encounter (HOSPITAL_COMMUNITY): Payer: Self-pay | Admitting: Pharmacist

## 2011-09-11 ENCOUNTER — Encounter (HOSPITAL_COMMUNITY): Payer: Self-pay

## 2011-09-11 ENCOUNTER — Encounter (HOSPITAL_COMMUNITY)
Admission: RE | Admit: 2011-09-11 | Discharge: 2011-09-11 | Disposition: A | Discharge status: Home / Self Care | Payer: Medicaid Other | Source: Ambulatory Visit | Attending: Obstetrics and Gynecology | Admitting: Obstetrics and Gynecology

## 2011-09-11 ENCOUNTER — Other Ambulatory Visit: Payer: Self-pay

## 2011-09-11 HISTORY — DX: Unspecified osteoarthritis, unspecified site: M19.90

## 2011-09-11 HISTORY — DX: Essential (primary) hypertension: I10

## 2011-09-11 HISTORY — DX: Gastro-esophageal reflux disease without esophagitis: K21.9

## 2011-09-11 HISTORY — DX: Benign neoplasm of connective and other soft tissue, unspecified: D21.9

## 2011-09-11 HISTORY — DX: Headache: R51

## 2011-09-11 LAB — CBC
MCH: 28.5 pg (ref 26.0–34.0)
MCV: 88.4 fL (ref 78.0–100.0)
Platelets: 208 10*3/uL (ref 150–400)
RDW: 13.2 % (ref 11.5–15.5)

## 2011-09-11 LAB — BASIC METABOLIC PANEL
Calcium: 9.2 mg/dL (ref 8.4–10.5)
Creatinine, Ser: 0.81 mg/dL (ref 0.50–1.10)
GFR calc Af Amer: >90 mL/min (ref >90)
GFR calc non Af Amer: 90 mL/min — ABNORMAL LOW (ref >90)
Sodium: 137 mEq/L (ref 135–145)

## 2011-09-11 NOTE — Patient Instructions (Addendum)
   Your procedure is scheduled ZO:XWRUEAV January 29th  Enter through the Main Entrance of [facility] at:9:30am Pick up the phone at the desk and dial [phone] and inform us of your arrival.  Please call this number if you have any problems the morning of surgery: [phone]  Remember: Do not eat food after midnight: Monday Do not drink clear liquids after: midnight Monday Take these medicines the morning of surgery with a SIP OF WATER: blood pressure medicines, bring inhaler with you  Do not wear jewelry, make-up, or FINGER nail polish Do not wear lotions, powders, perfumes or deodorant. Do not shave 48 hours prior to surgery. Do not bring valuables to the hospital.    Patients discharged on the day of surgery will not be allowed to drive home.     Remember to use your hibiclens as instructed.Please shower with 1/2 bottle the evening before your surgery and the other 1/2 bottle the morning of surgery.

## 2011-09-14 NOTE — H&P (Signed)
NAME:  [name], [name]               ACCOUNT NO.:  [id number]  MEDICAL RECORD NO.:  [id number]  LOCATION:  SDC                           FACILITY:  [facility]  PHYSICIAN:  Angela Roberts, M.D.   DATE OF BIRTH:  06/09/1971  DATE OF ADMISSION:  09/11/2011 DATE OF DISCHARGE:  09/11/2011                             HISTORY & PHYSICAL   HISTORY OF PRESENT ILLNESS:  [name] is a 40-year-old single black female, para 1-0-0-1, presenting for hysteroscopic fibroid resection and D and C because of a symptomatic submucosal fibroid, menorrhagia and dysmenorrhea.  During an evaluation for right lower quadrant pelvic pain, the patient underwent a pelvic ultrasound in December 2012.  That ultrasound revealed a uterus measuring 8.73 x 2.13 x 4.65 cm and a simple right ovarian cyst measuring 3.0 x 2.0 cm.  Additionally, she was noted to have a fundal fibroid measuring 1.1 x 1.1 x 1.3 cm, and a submucosal fibroid measuring 3.2 x 2.6 x 3.8 cm.  The patient reports a menstrual flow that will last for 7 days, requiring a pad change 3 times  a day.  She has significant pain with her menstrual periods, rating it at a 10/10 on a 10-point pain scale. Fortunately, however, she finds some relief with Tylenol with Codeine decreasing pain to 6/10 on a 10-point pain scale.  In recent days, the patient goes on to say she has had some some similar menstrual-like cramping almost daily; however, it is certainly much worse when she has her menstrual period.  Prior to August 21, 2011, the patient had not had a menstrual period since September 2013.(perhaps attributed to the Camila she was taking)  She denies any vaginitis symptoms, dysuria, dyspareunia, urinary frequency, or changes in her bowel movements.  Given the findings on pelvic ultrasound along with the patient's dysmenorrhea, she has decided to proceed with hysteroscopic resection of her submucosal fibroid.  PAST MEDICAL HISTORY:  OB HISTORY:  Gravida 1, para  1-0-0-1.  The patient had cesarean section in 2011.  She also has a history of severe preeclampsia.  GYN HISTORY:  Menarche 12-year-old.  Last period August 21, 2011.  She uses Camila progestin-only pills as her contraception.  Has a history of herpes simplex virus #2.  Has a remote history of an abnormal Pap smear. States, however, that it was simply repeated and since that time, it has been normal with her most recent normal being October 2012.  MEDICAL HISTORY:  Positive for asthma, degenerative disk disease, migraine headaches, hypertension, keloid formation, and cervical disk malalignment.  SURGICAL HISTORY:  In 2006, spinal fusion at L4-L5.  Denies any problems with anesthesia or history of blood transfusion.  FAMILY HISTORY:  Breast cancer, hypertension, migraines, stroke, liver disease, pancreatic cancer.  HABITS:  She does not use tobacco, alcohol, or illicit drugs.  SOCIAL HISTORY:  The patient is unemployed and is single.  CURRENT MEDICATIONS: 1. Labetalol 200 mg daily. 2. Hydrochlorothiazide 25 mg daily. 3. Singulair 10 mg daily. 4. Flonase 1 spray per nostril as needed. 5. K-Dur 20 mcg daily. 6. Ventolin inhaler 2 puffs every 6 hours as needed.  ALLERGIES:  The patient is allergic to MORPHINE and PERCOCET causing   itching.  ASPIRIN causes fever.  VICODIN and ROBAXIN caused severe nausea and vomiting.  SHELLFISH and BANANAS causes the patient's throat to swell.  She denies, however, any sensitivities to soy or peanuts and Latex(though there is a relationship between banana allergy and Latex reaction).    REVIEW OF SYSTEMS:  The patient does wear glasses.  She occasionally has shortness of breath due to her asthma.  Also has occasional headaches due to migraines.  Otherwise, no vision changes, chest pain, nausea, vomiting, diarrhea, myalgias, arthralgias, skin rashes, vaginitis symptoms, constipation, urinary frequency, urgency.  The patient does admit to  occasional right leg pain, and except as is otherwise mentioned in history of present illness, the Patient's review of systems is otherwise negative.  PHYSICAL EXAMINATION:  VITAL SIGNS:  Blood pressure 106/78, pulse is 74, respirations 16, temperature 98.2 degrees Fahrenheit orally, weight is 114 pounds, height 4 feet and 11 inches.  Body mass index is 23. NECK:  Supple without masses.  There is no thyromegaly or cervical adenopathy. HEART:  Regular rate and rhythm. LUNGS:  Clear. BACK:  No CVA tenderness. ABDOMEN:  With right lower quadrant tenderness, but no guarding or rebound.  There are no palpable masses or organomegaly. EXTREMITIES:  No clubbing, cyanosis, or edema. PELVIC:  EG BUS is normal.  Vagina is normal.  Cervix is nontender without lesions and it is displaced to the patient's right.  Uterus appears normal size, shape, and consistency without tenderness.  Adnexa; right adnexa is full with palpation and tender, though there is no discrete outline of a mass.  Left adnexa without tenderness or masses.  IMPRESSION: 1. Symptomatic submucosal fibroid. 2. Menorrhagia. 3. Dysmenorrhea.  DISPOSITION:  A discussion was held with the patient regarding indications for her procedure along with its risks which include but are not limited to reaction to anesthesia, damage to adjacent organs, bleeding, infection and the possibility that her pain may not resolve with this  procedure.  The patient verbalized understanding of these risk and has  consented to proceed with hysteroscopic resection of a submucosal fibroid followed by D and C with Dr. Roberts at [facility] on September 18, 2011, at 11 o'clock a.m.     Nicole Hafley J. Chrstopher Malenfant, P.A.-C   ______________________________ Angela Roberts, M.D.    EJP/MEDQ  D:  09/13/2011  T:  09/14/2011  Job:  [id number]  H&P reviewed No change in plan of care AYR 

## 2011-09-18 ENCOUNTER — Encounter (HOSPITAL_COMMUNITY): Payer: Self-pay | Admitting: Anesthesiology

## 2011-09-18 ENCOUNTER — Other Ambulatory Visit: Payer: Self-pay | Admitting: Obstetrics and Gynecology

## 2011-09-18 ENCOUNTER — Ambulatory Visit (HOSPITAL_COMMUNITY): Payer: Medicaid Other | Admitting: Anesthesiology

## 2011-09-18 ENCOUNTER — Encounter (HOSPITAL_COMMUNITY)
Admission: RE | Disposition: A | Discharge status: Home / Self Care | Payer: Self-pay | Source: Ambulatory Visit | Attending: Obstetrics and Gynecology

## 2011-09-18 ENCOUNTER — Ambulatory Visit (HOSPITAL_COMMUNITY)
Admission: RE | Admit: 2011-09-18 | Discharge: 2011-09-18 | Disposition: A | Discharge status: Home / Self Care | Payer: Medicaid Other | Source: Ambulatory Visit | Attending: Obstetrics and Gynecology | Admitting: Obstetrics and Gynecology

## 2011-09-18 DIAGNOSIS — D250 Submucous leiomyoma of uterus: Secondary | ICD-10-CM | POA: Insufficient documentation

## 2011-09-18 DIAGNOSIS — Z01818 Encounter for other preprocedural examination: Secondary | ICD-10-CM | POA: Insufficient documentation

## 2011-09-18 DIAGNOSIS — N946 Dysmenorrhea, unspecified: Secondary | ICD-10-CM | POA: Insufficient documentation

## 2011-09-18 DIAGNOSIS — Z01812 Encounter for preprocedural laboratory examination: Secondary | ICD-10-CM | POA: Insufficient documentation

## 2011-09-18 DIAGNOSIS — N83209 Unspecified ovarian cyst, unspecified side: Secondary | ICD-10-CM | POA: Insufficient documentation

## 2011-09-18 DIAGNOSIS — N920 Excessive and frequent menstruation with regular cycle: Secondary | ICD-10-CM | POA: Insufficient documentation

## 2011-09-18 LAB — SURGICAL PCR SCREEN: Staphylococcus aureus: NEGATIVE

## 2011-09-18 SURGERY — DILATATION & CURETTAGE/HYSTEROSCOPY WITH RESECTOCOPE
Anesthesia: General | Site: Uterus | Wound class: Clean Contaminated

## 2011-09-18 MED ORDER — GLYCINE 1.5 % IR SOLN
Status: DC | PRN
  Administered 2011-09-18: 3000 mL

## 2011-09-18 MED ORDER — FENTANYL CITRATE 0.05 MG/ML IJ SOLN
INTRAMUSCULAR | Status: DC | PRN
  Administered 2011-09-18 (×2): 50 ug via INTRAVENOUS

## 2011-09-18 MED ORDER — LIDOCAINE HCL (CARDIAC) 20 MG/ML IV SOLN
INTRAVENOUS | Status: AC
  Filled 2011-09-18: qty 5

## 2011-09-18 MED ORDER — MUPIROCIN 2 % EX OINT
TOPICAL_OINTMENT | CUTANEOUS | Status: AC
  Filled 2011-09-18: qty 22

## 2011-09-18 MED ORDER — ONDANSETRON HCL 4 MG/2ML IJ SOLN
INTRAMUSCULAR | Status: AC
  Filled 2011-09-18: qty 2

## 2011-09-18 MED ORDER — FENTANYL CITRATE 0.05 MG/ML IJ SOLN
INTRAMUSCULAR | Status: AC
  Administered 2011-09-18: 50 ug via INTRAVENOUS
  Filled 2011-09-18: qty 2

## 2011-09-18 MED ORDER — LIDOCAINE HCL 1 % IJ SOLN
INTRAMUSCULAR | Status: DC | PRN
  Administered 2011-09-18: 10 mL

## 2011-09-18 MED ORDER — PROPOFOL 10 MG/ML IV EMUL
INTRAVENOUS | Status: AC
  Filled 2011-09-18: qty 20

## 2011-09-18 MED ORDER — MIDAZOLAM HCL 2 MG/2ML IJ SOLN
INTRAMUSCULAR | Status: AC
  Filled 2011-09-18: qty 2

## 2011-09-18 MED ORDER — HYDROMORPHONE HCL 2 MG PO TABS: 2.0000 mg | ORAL_TABLET | ORAL | Status: AC | PRN

## 2011-09-18 MED ORDER — MIDAZOLAM HCL 5 MG/5ML IJ SOLN
INTRAMUSCULAR | Status: DC | PRN
  Administered 2011-09-18: 1 mg via INTRAVENOUS

## 2011-09-18 MED ORDER — LACTATED RINGERS IV SOLN
INTRAVENOUS | Status: DC
  Administered 2011-09-18 (×2): via INTRAVENOUS

## 2011-09-18 MED ORDER — LIDOCAINE HCL (CARDIAC) 20 MG/ML IV SOLN
INTRAVENOUS | Status: DC | PRN
  Administered 2011-09-18: 80 mg via INTRAVENOUS

## 2011-09-18 MED ORDER — FENTANYL CITRATE 0.05 MG/ML IJ SOLN
25.0000 ug | INTRAMUSCULAR | Status: DC | PRN
  Administered 2011-09-18: 50 ug via INTRAVENOUS

## 2011-09-18 MED ORDER — KETOROLAC TROMETHAMINE 30 MG/ML IJ SOLN: 15.0000 mg | Freq: Once | INTRAMUSCULAR | Status: DC | PRN

## 2011-09-18 MED ORDER — KETOROLAC TROMETHAMINE 30 MG/ML IJ SOLN
INTRAMUSCULAR | Status: DC | PRN
  Administered 2011-09-18: 30 mg via INTRAVENOUS

## 2011-09-18 MED ORDER — IBUPROFEN 600 MG PO TABS: 600.0000 mg | ORAL_TABLET | Freq: Four times a day (QID) | ORAL | Status: AC | PRN

## 2011-09-18 MED ORDER — KETOROLAC TROMETHAMINE 30 MG/ML IJ SOLN
INTRAMUSCULAR | Status: AC
  Filled 2011-09-18: qty 1

## 2011-09-18 MED ORDER — FENTANYL CITRATE 0.05 MG/ML IJ SOLN
INTRAMUSCULAR | Status: AC
  Filled 2011-09-18: qty 4

## 2011-09-18 MED ORDER — DEXAMETHASONE SODIUM PHOSPHATE 10 MG/ML IJ SOLN
INTRAMUSCULAR | Status: AC
  Filled 2011-09-18: qty 1

## 2011-09-18 MED ORDER — PROPOFOL 10 MG/ML IV EMUL
INTRAVENOUS | Status: DC | PRN
  Administered 2011-09-18: 150 mg via INTRAVENOUS

## 2011-09-18 SURGICAL SUPPLY — 15 items
CANISTER SUCTION 2500CC (MISCELLANEOUS) ×2 IMPLANT
CATH ROBINSON RED A/P 16FR (CATHETERS) ×2 IMPLANT
CLOTH BEACON ORANGE TIMEOUT ST (SAFETY) ×2 IMPLANT
CONTAINER PREFILL 10% NBF 60ML (FORM) ×4 IMPLANT
ELECT REM PT RETURN 9FT ADLT (ELECTROSURGICAL) ×2
ELECTRODE REM PT RTRN 9FT ADLT (ELECTROSURGICAL) ×1 IMPLANT
GLOVE BIO SURGEON STRL SZ7.5 (GLOVE) ×4 IMPLANT
GLOVE BIOGEL PI IND STRL 7.5 (GLOVE) ×1 IMPLANT
GLOVE BIOGEL PI INDICATOR 7.5 (GLOVE) ×1
GOWN PREVENTION PLUS LG XLONG (DISPOSABLE) ×3 IMPLANT
GOWN STRL REIN XL XLG (GOWN DISPOSABLE) ×2 IMPLANT
LOOP ANGLED CUTTING 22FR (CUTTING LOOP) ×1 IMPLANT
PACK HYSTEROSCOPY LF (CUSTOM PROCEDURE TRAY) ×2 IMPLANT
TOWEL OR 17X24 6PK STRL BLUE (TOWEL DISPOSABLE) ×4 IMPLANT
WATER STERILE IRR 1000ML POUR (IV SOLUTION) ×2 IMPLANT

## 2011-09-18 NOTE — Anesthesia Preprocedure Evaluation (Signed)
Anesthesia Evaluation  Patient identified by MRN, date of birth, ID band Patient awake    Reviewed: Allergy & Precautions, H&P , NPO status , Patient's Chart, lab work & pertinent test results, reviewed documented beta blocker date and time   History of Anesthesia Complications Negative for: history of anesthetic complications  Airway Mallampati: I TM Distance: >3 FB Neck ROM: limited   Comment: Neck ROM limited due to pain.  Patient reports she has a disc problem that is being evaluated by ortho Dental  (+) Teeth Intact   Pulmonary asthma (last inhaler use 2-3 months ago) ,  clear to auscultation  Pulmonary exam normal       Cardiovascular Exercise Tolerance: Good hypertension (well controlled on labetalol and HCTZ), On Home Beta Blockers and On Medications regular Normal    Neuro/Psych  Headaches (frequent headaches - some migraines, some sinus), Negative Psych ROS   GI/Hepatic Neg liver ROS, GERD- (taking her zantac now)  Medicated,  Endo/Other  Negative Endocrine ROS  Renal/GU negative Renal ROS  Female GU complaint     Musculoskeletal  (+) Arthritis - (DDD, has had lumbar fusion),   Abdominal   Peds  Hematology negative hematology ROS (+)   Anesthesia Other Findings Allergic to ASA, but can take ibuprofen Allergic to shrimp, but can eat all other shellfish  Reproductive/Obstetrics negative OB ROS                           Anesthesia Physical Anesthesia Plan  ASA: II  Anesthesia Plan: General LMA   Post-op Pain Management:    Induction:   Airway Management Planned:   Additional Equipment:   Intra-op Plan:   Post-operative Plan:   Informed Consent: I have reviewed the patients History and Physical, chart, labs and discussed the procedure including the risks, benefits and alternatives for the proposed anesthesia with the patient or authorized representative who has indicated  his/her understanding and acceptance.   Dental Advisory Given  Plan Discussed with: CRNA and Surgeon  Anesthesia Plan Comments:         Anesthesia Quick Evaluation

## 2011-09-18 NOTE — Preoperative (Signed)
Beta Blockers   Reason not to administer Beta Blockers:Hold beta blocker due to other, patient took scheduled medication at home this a.m.

## 2011-09-18 NOTE — H&P (Signed)
NAMEJASEMINE, NAWAZ NO.:  [id number]  MEDICAL RECORD NO.:  [id number]  LOCATION:  [facility]                           FACILITY:  [facility]  PHYSICIAN:  Osborn Coho, M.D.   DATE OF BIRTH:  [birth year]  DATE OF ADMISSION:  09/11/2011 DATE OF DISCHARGE:  09/11/2011                             HISTORY & PHYSICAL   HISTORY OF PRESENT ILLNESS:  Ms. Bethune is a 41 year old single black female, para 1-0-0-1, presenting for hysteroscopic fibroid resection and D and C because of a symptomatic submucosal fibroid, menorrhagia and dysmenorrhea.  During an evaluation for right lower quadrant pelvic pain, the patient underwent a pelvic ultrasound in December 2012.  That ultrasound revealed a uterus measuring 8.73 x 2.13 x 4.65 cm and a simple right ovarian cyst measuring 3.0 x 2.0 cm.  Additionally, she was noted to have a fundal fibroid measuring 1.1 x 1.1 x 1.3 cm, and a submucosal fibroid measuring 3.2 x 2.6 x 3.8 cm.  The patient reports a menstrual flow that will last for 7 days, requiring a pad change 3 times  a day.  She has significant pain with her menstrual periods, rating it at a 10/10 on a 10-point pain scale. Fortunately, however, she finds some relief with Tylenol with Codeine decreasing pain to 6/10 on a 10-point pain scale.  In recent days, the patient goes on to say she has had some some similar menstrual-like cramping almost daily; however, it is certainly much worse when she has her menstrual period.  Prior to August 21, 2011, the patient had not had a menstrual period since September 2013.(perhaps attributed to the Camila she was taking)  She denies any vaginitis symptoms, dysuria, dyspareunia, urinary frequency, or changes in her bowel movements.  Given the findings on pelvic ultrasound along with the patient's dysmenorrhea, she has decided to proceed with hysteroscopic resection of her submucosal fibroid.  PAST MEDICAL HISTORY:  OB HISTORY:  Gravida 1, para  1-0-0-1.  The patient had cesarean section in 2011.  She also has a history of severe preeclampsia.  GYN HISTORY:  Menarche 41 year old.  Last period August 21, 2011.  She uses Camila progestin-only pills as her contraception.  Has a history of herpes simplex virus #2.  Has a remote history of an abnormal Pap smear. States, however, that it was simply repeated and since that time, it has been normal with her most recent normal being October 2012.  MEDICAL HISTORY:  Positive for asthma, degenerative disk disease, migraine headaches, hypertension, keloid formation, and cervical disk malalignment.  SURGICAL HISTORY:  In 2006, spinal fusion at L4-L5.  Denies any problems with anesthesia or history of blood transfusion.  FAMILY HISTORY:  Breast cancer, hypertension, migraines, stroke, liver disease, pancreatic cancer.  HABITS:  She does not use tobacco, alcohol, or illicit drugs.  SOCIAL HISTORY:  The patient is unemployed and is single.  CURRENT MEDICATIONS: 1. Labetalol 200 mg daily. 2. Hydrochlorothiazide 25 mg daily. 3. Singulair 10 mg daily. 4. Flonase 1 spray per nostril as needed. 5. K-Dur 20 mcg daily. 6. Ventolin inhaler 2 puffs every 6 hours as needed.  ALLERGIES:  The patient is allergic to MORPHINE and PERCOCET causing  itching.  ASPIRIN causes fever.  VICODIN and ROBAXIN caused severe nausea and vomiting.  SHELLFISH and BANANAS causes the patient's throat to swell.  She denies, however, any sensitivities to soy or peanuts and Latex(though there is a relationship between banana allergy and Latex reaction).    REVIEW OF SYSTEMS:  The patient does wear glasses.  She occasionally has shortness of breath due to her asthma.  Also has occasional headaches due to migraines.  Otherwise, no vision changes, chest pain, nausea, vomiting, diarrhea, myalgias, arthralgias, skin rashes, vaginitis symptoms, constipation, urinary frequency, urgency.  The patient does admit to  occasional right leg pain, and except as is otherwise mentioned in history of present illness, the Patient's review of systems is otherwise negative.  PHYSICAL EXAMINATION:  VITAL SIGNS:  Blood pressure 106/78, pulse is 74, respirations 16, temperature 98.2 degrees Fahrenheit orally, weight is 114 pounds, height 4 feet and 11 inches.  Body mass index is 23. NECK:  Supple without masses.  There is no thyromegaly or cervical adenopathy. HEART:  Regular rate and rhythm. LUNGS:  Clear. BACK:  No CVA tenderness. ABDOMEN:  With right lower quadrant tenderness, but no guarding or rebound.  There are no palpable masses or organomegaly. EXTREMITIES:  No clubbing, cyanosis, or edema. PELVIC:  EG BUS is normal.  Vagina is normal.  Cervix is nontender without lesions and it is displaced to the patient's right.  Uterus appears normal size, shape, and consistency without tenderness.  Adnexa; right adnexa is full with palpation and tender, though there is no discrete outline of a mass.  Left adnexa without tenderness or masses.  IMPRESSION: 1. Symptomatic submucosal fibroid. 2. Menorrhagia. 3. Dysmenorrhea.  DISPOSITION:  A discussion was held with the patient regarding indications for her procedure along with its risks which include but are not limited to reaction to anesthesia, damage to adjacent organs, bleeding, infection and the possibility that her pain may not resolve with this  procedure.  The patient verbalized understanding of these risk and has  consented to proceed with hysteroscopic resection of a submucosal fibroid followed by D and C with Dr. Su Hilt at [facility] on September 18, 2011, at 11 o'clock a.m.     Elmira J. Lowell Guitar, P.A.-C   ______________________________ Osborn Coho, M.D.    EJP/MEDQ  D:  09/13/2011  T:  09/14/2011  Job:  [id number]  H&P reviewed No change in plan of care AYR

## 2011-09-18 NOTE — Anesthesia Postprocedure Evaluation (Signed)
Anesthesia Post Note  Patient: [name]  Procedure(s) Performed:  DILATATION & CURETTAGE/HYSTEROSCOPY WITH RESECTOCOPE  Anesthesia type: GA  Patient location: PACU  Post pain: Pain level controlled  Post assessment: Post-op Vital signs reviewed  Last Vitals:  Filed Vitals:   09/18/11 1230  BP: 119/73  Pulse: 71  Temp:   Resp: 40    Post vital signs: Reviewed  Level of consciousness: sedated  Complications: No apparent anesthesia complications

## 2011-09-18 NOTE — Op Note (Signed)
Preop Diagnosis: Submucosal Fibroid, menorrhagia, Dysmenorrhea   Postop Diagnosis: Submucosal Fibroid, menorrhagia, Dysmenorrhea   Procedure: HYSTEROSCOPY WITH RESECTOSCOPE   Anesthesia: General   Anesthesiologist: Velna Hatchet, MD   Attending: Purcell Nails, MD   Assistant: N/a  Findings: Appearance of a possible posterior wall submucosal fibroid  Pathology: 1.endometrial curettings 2.portions of resected fibroid  Fluids: 1000cc  UOP: Quantity sufficient via straight cath prior to procedure  EBL: Minimal  Complications: None  Procedure: The patient was taken to the operating room after the risks, benefits and alternatives were discussed with the patient. The patient verbalized understanding and consent signed and witnessed. The patient was placed under general anesthesia with an LMA per anesthesiologist and prepped and draped in the normal sterile fashion.  Time Out was performed per protocol.  A bivalve speculum was placed in the patient's vagina and the anterior lip of the cervix was grasped with a single tooth tenaculum. A paracervical block was administered using a total of 10 cc of 1% lidocaine. The uterus sounded to 8cm cm. The cervix was dilated for passage of the hysteroscope.  The hysteroscope was introduced into the uterine cavity and findings as noted above. Sharp curettage was performed until a gritty texture was noted and curettings were sent to pathology.   The resectoscope was introduced and the area in the posterior wall suspicious for a submucosal fibroid was resected.  Curettage was performed once again.  The hysteroscope was reintroduced and no obvious remaining intracavitary lesions were noted.  All instruments were removed. Sponge lap and needle count was correct. The patient tolerated the procedure well and was returned to the recovery room in good condition.

## 2011-09-18 NOTE — Transfer of Care (Signed)
Immediate Anesthesia Transfer of Care Note  Patient: [name]  Procedure(s) Performed:  DILATATION & CURETTAGE/HYSTEROSCOPY WITH RESECTOCOPE  Patient Location: PACU  Anesthesia Type: General  Level of Consciousness: awake, alert , oriented and patient cooperative  Airway & Oxygen Therapy: Patient Spontanous Breathing and Patient connected to nasal cannula oxygen  Post-op Assessment: Report given to PACU RN and Post -op Vital signs reviewed and stable  Post vital signs: Reviewed and stable  Complications: No apparent anesthesia complications

## 2011-10-31 ENCOUNTER — Other Ambulatory Visit: Payer: Self-pay | Admitting: Gastroenterology

## 2011-11-07 ENCOUNTER — Ambulatory Visit
Admission: RE | Admit: 2011-11-07 | Discharge: 2011-11-07 | Disposition: A | Discharge status: Home / Self Care | Payer: Medicaid Other | Source: Ambulatory Visit | Attending: Gastroenterology | Admitting: Gastroenterology

## 2012-02-07 ENCOUNTER — Other Ambulatory Visit: Payer: Self-pay | Admitting: Obstetrics and Gynecology

## 2012-02-08 ENCOUNTER — Telehealth: Payer: Self-pay

## 2012-02-08 NOTE — Telephone Encounter (Signed)
Returned call to pt regarding [facility] refill. Pt not having any problems at this time and no skipped dosages. Pt not due for aex until 05/2012. RX was call to [facility] on [address] for Camilla 0.35mg  #1 with 4 refills. [name]

## 2012-02-29 ENCOUNTER — Other Ambulatory Visit: Payer: Self-pay | Admitting: Obstetrics and Gynecology

## 2012-03-04 ENCOUNTER — Telehealth: Payer: Self-pay

## 2012-03-04 NOTE — Telephone Encounter (Signed)
Per AR, 1 RF of T#3 #30 sig 1-2 po q 4-6 hrs prn pain 0 RF's. Called to [facility] pharmacy. JO, CMA

## 2012-03-05 DIAGNOSIS — J45909 Unspecified asthma, uncomplicated: Secondary | ICD-10-CM | POA: Insufficient documentation

## 2012-03-05 DIAGNOSIS — J302 Other seasonal allergic rhinitis: Secondary | ICD-10-CM | POA: Insufficient documentation

## 2012-03-05 NOTE — Telephone Encounter (Signed)
Ar pt 

## 2012-03-12 DIAGNOSIS — I10 Essential (primary) hypertension: Secondary | ICD-10-CM | POA: Insufficient documentation

## 2012-03-12 DIAGNOSIS — L819 Disorder of pigmentation, unspecified: Secondary | ICD-10-CM | POA: Insufficient documentation

## 2012-03-13 ENCOUNTER — Telehealth: Payer: Self-pay

## 2012-04-09 ENCOUNTER — Telehealth: Payer: Self-pay

## 2012-04-09 NOTE — Telephone Encounter (Signed)
Spoke to [employer] at [facility]. Denied RF for T#3. Pt needs re-eval for continued need for this med. We just filled it last month. Pharmacy will tell pt she needs to make appt. Melody Comas A

## 2012-06-12 ENCOUNTER — Other Ambulatory Visit: Payer: Self-pay | Admitting: Obstetrics and Gynecology

## 2012-06-18 ENCOUNTER — Ambulatory Visit: Payer: Self-pay | Admitting: Obstetrics and Gynecology

## 2012-06-23 NOTE — Telephone Encounter (Signed)
Note to close encounter only. [name], Jacqueline A  

## 2012-06-29 ENCOUNTER — Other Ambulatory Visit (HOSPITAL_COMMUNITY): Payer: Self-pay | Admitting: Obstetrics and Gynecology

## 2012-07-23 ENCOUNTER — Encounter (HOSPITAL_COMMUNITY): Payer: Self-pay | Admitting: *Deleted

## 2012-07-23 ENCOUNTER — Emergency Department (INDEPENDENT_AMBULATORY_CARE_PROVIDER_SITE_OTHER)
Admission: EM | Admit: 2012-07-23 | Discharge: 2012-07-23 | Disposition: A | Discharge status: Home / Self Care | Payer: Medicaid Other | Source: Home / Self Care | Attending: Family Medicine | Admitting: Family Medicine

## 2012-07-23 DIAGNOSIS — H6690 Otitis media, unspecified, unspecified ear: Secondary | ICD-10-CM

## 2012-07-23 DIAGNOSIS — H00019 Hordeolum externum unspecified eye, unspecified eyelid: Secondary | ICD-10-CM

## 2012-07-23 DIAGNOSIS — H6691 Otitis media, unspecified, right ear: Secondary | ICD-10-CM

## 2012-07-23 DIAGNOSIS — O1490 Unspecified pre-eclampsia, unspecified trimester: Secondary | ICD-10-CM | POA: Insufficient documentation

## 2012-07-23 HISTORY — DX: Unspecified pre-eclampsia, unspecified trimester: O14.90

## 2012-07-23 HISTORY — DX: Pure hypercholesterolemia, unspecified: E78.00

## 2012-07-23 MED ORDER — HYDROCODONE-ACETAMINOPHEN 5-325 MG PO TABS: 2.0000 | ORAL_TABLET | ORAL | Status: DC | PRN

## 2012-07-23 MED ORDER — AMOXICILLIN 500 MG PO CAPS: 500.0000 mg | ORAL_CAPSULE | Freq: Three times a day (TID) | ORAL | Status: DC

## 2012-07-23 MED ORDER — ANTIPYRINE-BENZOCAINE 5.4-1.4 % OT SOLN: 3.0000 [drp] | OTIC | Status: DC | PRN

## 2012-07-23 MED ORDER — TOBRAMYCIN 0.3 % OP SOLN: 1.0000 [drp] | OPHTHALMIC | Status: DC

## 2012-07-23 NOTE — ED Notes (Signed)
Earache is causing her to have a headache about 9/10.

## 2012-07-23 NOTE — ED Notes (Signed)
C/o R ear ache onset yesterday.  Tried  Warm olive oil x 2 without relief.  No drainage.  Also had a little sore throat since last Wed. and whitehead on L eyelid since last night.

## 2012-07-23 NOTE — ED Provider Notes (Signed)
History     CSN: [id number]  Arrival date & time 07/23/12  1616   First MD Initiated Contact with Patient 07/23/12 1733      Chief Complaint  Patient presents with  . Otalgia    (Consider location/radiation/quality/duration/timing/severity/associated sxs/prior treatment) Patient is a 41 y.o. female presenting with ear pain. The history is provided by the patient. No language interpreter was used.  Otalgia This is a new problem. The current episode started 2 days ago. There is pain in the right ear. The problem occurs constantly. The problem has been gradually worsening. There has been no fever. Associated symptoms include sore throat. Past medical history comments: hx of occ ear infections.  Pt also complains of a sorethroat and swelling to left upper eyelid  Past Medical History  Diagnosis Date  . Hypertension   . Asthma     controlled-last used rescue inhaler 3 months ago  . Headache   . GERD (gastroesophageal reflux disease)     zantac/pepcid prn  . Arthritis     degenerative disc disease-work related injury  . Fibroids     Past Surgical History  Procedure Date  . Wisdom tooth extraction   . Spinal fusion 2006    L4-5  . Cesarean section 2011    No family history on file.  History  Substance Use Topics  . Smoking status: Never Smoker   . Smokeless tobacco: Not on file  . Alcohol Use: No    OB History    Grav Para Term Preterm Abortions TAB SAB Ect Mult Living                  Review of Systems  HENT: Positive for ear pain and sore throat.   Eyes: Positive for pain.  All other systems reviewed and are negative.    Allergies  Aspirin; Methocarbamol; Morphine and related; and Percocet  Home Medications   Current Outpatient Rx  Name  Route  Sig  Dispense  Refill  . ALBUTEROL SULFATE HFA 108 (90 BASE) MCG/ACT IN AERS   Inhalation   Inhale 1-2 puffs into the lungs every 6 (six) hours as needed. For wheezing         . CAMILA 0.35 MG PO TABS       TAKE ONE TABLET BY MOUTH ONE TIME DAILY   1 each   PRN   . DESONIDE 0.05 % EX CREA   Topical   Apply 1 application topically 2 (two) times daily as needed. For itching         . FLUTICASONE PROPIONATE 50 MCG/ACT NA SUSP   Nasal   Place 1 spray into the nose daily as needed. For allergies         . HYDROCHLOROTHIAZIDE 25 MG PO TABS   Oral   Take 12.5 mg by mouth daily.         Marland Kitchen LABETALOL HCL 200 MG PO TABS   Oral   Take 200 mg by mouth 2 (two) times daily.         Marland Kitchen MONTELUKAST SODIUM 10 MG PO TABS   Oral   Take 10 mg by mouth at bedtime.         Marland Kitchen POTASSIUM CHLORIDE CRYS ER 20 MEQ PO TBCR   Oral   Take 20 mEq by mouth daily.         Marland Kitchen SIMVASTATIN 40 MG PO TABS   Oral   Take 40 mg by mouth every evening.  BP 138/84  Pulse 63  Temp 98.2 F (36.8 C) (Oral)  Resp 16  SpO2 99%  Physical Exam  Nursing note and vitals reviewed. Constitutional: She appears well-developed and well-nourished.  HENT:  Head: Normocephalic.  Left Ear: External ear normal.  Mouth/Throat: Oropharynx is clear and moist.       Right tm erythematous  Eyes: Conjunctivae normal are normal. Pupils are equal, round, and reactive to light.       Sty left eyelid  Cardiovascular: Normal rate.   Pulmonary/Chest: Effort normal.  Abdominal: Soft.  Musculoskeletal: Normal range of motion.  Neurological: She is alert.  Skin: Skin is warm.    ED Course  Procedures (including critical care time)  Labs Reviewed - No data to display No results found.   No diagnosis found.    MDM  Amoxicillin 500 30 tid,  tobrex opth, hydrocodone         Lonia Skinner [place], [place] 07/23/12 1753

## 2012-07-26 NOTE — ED Provider Notes (Signed)
Medical screening examination/treatment/procedure(s) were performed by resident physician or non-physician practitioner and as supervising physician I was immediately available for consultation/collaboration.   KINDL,JAMES DOUGLAS MD.    James D Kindl, MD 07/26/12 1933 

## 2012-08-04 ENCOUNTER — Ambulatory Visit (INDEPENDENT_AMBULATORY_CARE_PROVIDER_SITE_OTHER): Payer: Medicaid Other | Admitting: Obstetrics and Gynecology

## 2012-08-04 ENCOUNTER — Encounter: Payer: Self-pay | Admitting: Obstetrics and Gynecology

## 2012-08-04 VITALS — BP 108/62 | Ht 60.0 in | Wt 123.0 lb

## 2012-08-04 DIAGNOSIS — Z0000 Encounter for general adult medical examination without abnormal findings: Secondary | ICD-10-CM

## 2012-08-04 DIAGNOSIS — Z1231 Encounter for screening mammogram for malignant neoplasm of breast: Secondary | ICD-10-CM

## 2012-08-04 DIAGNOSIS — R300 Dysuria: Secondary | ICD-10-CM

## 2012-08-04 DIAGNOSIS — N390 Urinary tract infection, site not specified: Secondary | ICD-10-CM

## 2012-08-04 DIAGNOSIS — Z124 Encounter for screening for malignant neoplasm of cervix: Secondary | ICD-10-CM

## 2012-08-04 DIAGNOSIS — Z01419 Encounter for gynecological examination (general) (routine) without abnormal findings: Secondary | ICD-10-CM

## 2012-08-04 LAB — POCT URINALYSIS DIPSTICK
Glucose, UA: NEGATIVE
Ketones, UA: NEGATIVE
Leukocytes, UA: NEGATIVE
Urobilinogen, UA: 1

## 2012-08-04 MED ORDER — NORETHINDRONE 0.35 MG PO TABS: 1.0000 | ORAL_TABLET | Freq: Every day | ORAL | Status: DC

## 2012-08-04 MED ORDER — ACETAMINOPHEN-CODEINE #3 300-30 MG PO TABS: 1.0000 | ORAL_TABLET | Freq: Four times a day (QID) | ORAL | Status: DC | PRN

## 2012-08-04 NOTE — Addendum Note (Signed)
Addended by: Lerry Liner D on: 08/04/2012 03:40 PM   Modules accepted: Orders

## 2012-08-04 NOTE — Progress Notes (Addendum)
Contraception Camilla  Last pap 06/05/2011 Last Mammo none Last Colonoscopy none  Last Dexa Scan none Primary MD Dr. Clarita Leber Abuse at Home none  Still has cramping around time of cycle but doesn't actually have cycle.  Requests refills of Camilla and tylenol #3.  Filed Vitals:   08/04/12 1508  BP: 108/62   ROS: noncontributory  Physical Examination: General appearance - alert, well appearing, and in no distress Neck - supple, no significant adenopathy Chest - clear to auscultation, no wheezes, rales or rhonchi, symmetric air entry Heart - normal rate and regular rhythm Abdomen - soft, nontender, nondistended, no masses or organomegaly Breasts - breasts appear normal, no suspicious masses, no skin or nipple changes or axillary nodes Pelvic - normal external genitalia, vulva, vagina, cervix, uterus and adnexa Back exam - no CVAT Extremities - no edema, redness or tenderness in the calves or thighs  Results for orders placed in visit on 08/04/12  POCT URINALYSIS DIPSTICK      Component Value Range   Color, UA       Clarity, UA       Glucose, UA neg     Bilirubin, UA neg     Ketones, UA neg     Spec Grav, UA 1.020     Blood, UA trace     pH, UA 5.0     Protein, UA trace     Urobilinogen, UA 1.0     Nitrite, UA neg     Leukocytes, UA Negative     A/P Rx camilla and tylenol #3 Pap today UA - UCx Mammo

## 2012-08-04 NOTE — Addendum Note (Signed)
Addended by: Marla Roe A on: 08/04/2012 03:47 PM   Modules accepted: Orders

## 2012-08-05 LAB — PAP IG, CT-NG, RFX HPV ASCU

## 2012-08-06 LAB — URINE CULTURE: Colony Count: NO GROWTH

## 2012-08-08 ENCOUNTER — Ambulatory Visit: Payer: Self-pay | Admitting: Obstetrics and Gynecology

## 2012-08-18 ENCOUNTER — Telehealth: Payer: Self-pay | Admitting: Obstetrics and Gynecology

## 2012-08-18 DIAGNOSIS — Z139 Encounter for screening, unspecified: Secondary | ICD-10-CM

## 2012-08-18 NOTE — Telephone Encounter (Signed)
Spoke with pt rgd labs informed labs wnl pt voice understanding

## 2012-08-18 NOTE — Telephone Encounter (Signed)
Lm on vm tcb rgd msg 

## 2012-09-27 ENCOUNTER — Encounter (HOSPITAL_COMMUNITY): Payer: Self-pay | Admitting: Emergency Medicine

## 2012-09-27 ENCOUNTER — Emergency Department (INDEPENDENT_AMBULATORY_CARE_PROVIDER_SITE_OTHER)
Admission: EM | Admit: 2012-09-27 | Discharge: 2012-09-27 | Disposition: A | Discharge status: Home / Self Care | Payer: Medicaid Other | Source: Home / Self Care | Attending: Family Medicine | Admitting: Family Medicine

## 2012-09-27 DIAGNOSIS — Z4889 Encounter for other specified surgical aftercare: Secondary | ICD-10-CM

## 2012-09-27 DIAGNOSIS — IMO0002 Reserved for concepts with insufficient information to code with codable children: Secondary | ICD-10-CM

## 2012-09-27 DIAGNOSIS — L304 Erythema intertrigo: Secondary | ICD-10-CM

## 2012-09-27 DIAGNOSIS — Z23 Encounter for immunization: Secondary | ICD-10-CM

## 2012-09-27 DIAGNOSIS — L02412 Cutaneous abscess of left axilla: Secondary | ICD-10-CM

## 2012-09-27 DIAGNOSIS — L538 Other specified erythematous conditions: Secondary | ICD-10-CM

## 2012-09-27 MED ORDER — NYSTATIN-TRIAMCINOLONE 100000-0.1 UNIT/GM-% EX CREA: TOPICAL_CREAM | CUTANEOUS | Status: DC

## 2012-09-27 MED ORDER — INFLUENZA VIRUS VACC SPLIT PF IM SUSP
0.5000 mL | Freq: Once | INTRAMUSCULAR | Status: AC
  Administered 2012-09-27: 0.5 mL via INTRAMUSCULAR

## 2012-09-27 NOTE — ED Provider Notes (Signed)
History     CSN: [id number]  Arrival date & time 09/27/12  1415   First MD Initiated Contact with Patient 09/27/12 1557      Chief Complaint  Patient presents with  . Recurrent Skin Infections    boil under right axillary x 3 days     Patient is a 42 y.o. female presenting with abscess. The history is provided by the patient.  Abscess Location:  Torso Torso abscess location:  L axilla Size:  3 cm Abscess quality: fluctuance and painful   Abscess quality: not draining, no induration, no redness and no warmth   Duration:  3 days Progression:  Worsening Pain details:    Severity:  Moderate Chronicity:  Recurrent Pt reports 2 to 3 days ago she noticed a tender "bump" under her (L) arm that quickly progressed in size and painfulness. Admits to h/o abscesses in the past. Also reports persistent rash behind both of her ears that she has had for 2 to 3 weeks that often has a yellowish color and foul smell. Pt also requesting a flu vaccine. States her PCP's office ran out recnetl and she was unable to get one. Denies any sx's illness. Past Medical History  Diagnosis Date  . Hypertension   . Asthma     controlled-last used rescue inhaler 3 months ago  . Headache   . GERD (gastroesophageal reflux disease)     zantac/pepcid prn  . Arthritis     degenerative disc disease-work related injury  . Fibroids   . High cholesterol   . Pre-eclampsia   . Pre-eclampsia     Past Surgical History  Procedure Laterality Date  . Wisdom tooth extraction    . Spinal fusion  2006    L4-5  . Cesarean section  2011  . Fibroidectomy      Family History  Problem Relation Age of Onset  . Hypertension Mother   . High Cholesterol Mother   . Pneumonia Father   . Liver disease Father   . Hypertension Other   . Stroke Other   . High Cholesterol Other   . Breast cancer Maternal Aunt   . Pancreatic cancer Maternal Uncle     History  Substance Use Topics  . Smoking status: Never Smoker   .  Smokeless tobacco: Never Used  . Alcohol Use: No    OB History   Grav Para Term Preterm Abortions TAB SAB Ect Mult Living   1 1        1       Review of Systems  Constitutional: Negative.   HENT: Negative.   Eyes: Negative.   Respiratory: Negative.   Cardiovascular: Negative.   Gastrointestinal: Negative.   Endocrine: Negative.   Genitourinary: Negative.   Skin: Positive for rash.  Allergic/Immunologic: Negative.   Neurological: Negative.   Hematological: Negative.   Psychiatric/Behavioral: Negative.     Allergies  Aspirin; Methocarbamol; Morphine and related; and Percocet  Home Medications   Current Outpatient Rx  Name  Route  Sig  Dispense  Refill  . antipyrine-benzocaine (AURALGAN) otic solution   Right Ear   Place 3 drops into the right ear every 2 (two) hours as needed for pain.   10 mL   0   . CAMILA 0.35 MG tablet      TAKE ONE TABLET BY MOUTH ONE TIME DAILY   1 each   PRN   . hydrochlorothiazide (HYDRODIURIL) 25 MG tablet   Oral   Take 12.5 mg by  mouth daily.         Marland Kitchen labetalol (NORMODYNE) 200 MG tablet   Oral   Take 200 mg by mouth 2 (two) times daily.         . simvastatin (ZOCOR) 40 MG tablet   Oral   Take 40 mg by mouth every evening.         . tobramycin (TOBREX) 0.3 % ophthalmic solution   Left Eye   Place 1 drop into the left eye every 4 (four) hours.   5 mL   0   . acetaminophen-codeine (TYLENOL #3) 300-30 MG per tablet   Oral   Take 1-2 tablets by mouth every 6 (six) hours as needed for pain.   30 tablet   1   . albuterol (PROVENTIL HFA;VENTOLIN HFA) 108 (90 BASE) MCG/ACT inhaler   Inhalation   Inhale 1-2 puffs into the lungs every 6 (six) hours as needed. For wheezing         . amoxicillin (AMOXIL) 500 MG capsule   Oral   Take 1 capsule (500 mg total) by mouth 3 (three) times daily.   30 capsule   0   . desonide (DESOWEN) 0.05 % cream   Topical   Apply 1 application topically 2 (two) times daily as needed. For  itching         . fluticasone (FLONASE) 50 MCG/ACT nasal spray   Nasal   Place 1 spray into the nose daily as needed. For allergies         . HYDROcodone-acetaminophen (NORCO/VICODIN) 5-325 MG per tablet   Oral   Take 2 tablets by mouth every 4 (four) hours as needed for pain.   10 tablet   0   . montelukast (SINGULAIR) 10 MG tablet   Oral   Take 10 mg by mouth at bedtime.         . norethindrone (MICRONOR,CAMILA,ERRIN) 0.35 MG tablet   Oral   Take 1 tablet (0.35 mg total) by mouth daily.   1 Package   11   . potassium chloride SA (K-DUR,KLOR-CON) 20 MEQ tablet   Oral   Take 20 mEq by mouth daily.           BP 110/80  Pulse 61  Temp(Src) 98.1 F (36.7 C) (Oral)  Resp 18  SpO2 100%  Physical Exam  ED Course  INCISION AND DRAINAGE Date/Time: 09/27/2012 5:55 PM Performed by: Leanne Chang Authorized by: Bradd Canary D Consent: Verbal consent obtained. Risks and benefits: risks, benefits and alternatives were discussed Consent given by: patient Patient understanding: patient states understanding of the procedure being performed Required items: required blood products, implants, devices, and special equipment available Patient identity confirmed: verbally with patient and arm band Type: abscess Body area: trunk Anesthesia: local infiltration Local anesthetic: lidocaine 1% without epinephrine Patient sedated: no Scalpel size: 11 Incision type: single straight Complexity: simple Drainage: purulent and serosanguinous Drainage amount: moderate Wound treatment: wound left open Packing material: 1/4 in iodoform gauze Patient tolerance: Patient tolerated the procedure well with no immediate complications.   (including critical care time)  Labs Reviewed - No data to display No results found.   No diagnosis found.    MDM  Abscess under (L) arm, I&D performed. Packing placed. No localized cellulitis so n0 PO antibiotic  Indicated. Intertrigo  behind both ears, will treat w/ Nystatin cream for 10 days. Flu vaccine given. Pt to return in 2 days for a recheck.  Leanne Chang, NP 09/27/12 1810

## 2012-09-27 NOTE — ED Notes (Signed)
Pt c/o boil under right axillary x 3 days. Area is swollen and painful touch. Pt denies drainage. Pt has been using warm compresses with no relief.   Also having problems with scalp irritation and rawness behind left and right ear.

## 2012-09-29 NOTE — ED Provider Notes (Signed)
Medical screening examination/treatment/procedure(s) were performed by resident physician or non-physician practitioner and as supervising physician I was immediately available for consultation/collaboration.   KINDL,JAMES DOUGLAS MD.   James D Kindl, MD 09/29/12 1438 

## 2012-09-30 ENCOUNTER — Encounter (HOSPITAL_COMMUNITY): Payer: Self-pay | Admitting: *Deleted

## 2012-09-30 ENCOUNTER — Emergency Department (INDEPENDENT_AMBULATORY_CARE_PROVIDER_SITE_OTHER)
Admission: EM | Admit: 2012-09-30 | Discharge: 2012-09-30 | Disposition: A | Discharge status: Home / Self Care | Payer: Medicaid Other | Source: Home / Self Care

## 2012-09-30 DIAGNOSIS — IMO0002 Reserved for concepts with insufficient information to code with codable children: Secondary | ICD-10-CM

## 2012-09-30 DIAGNOSIS — L02412 Cutaneous abscess of left axilla: Secondary | ICD-10-CM

## 2012-09-30 DIAGNOSIS — Z4889 Encounter for other specified surgical aftercare: Secondary | ICD-10-CM

## 2012-09-30 MED ORDER — SULFAMETHOXAZOLE-TRIMETHOPRIM 800-160 MG PO TABS: 1.0000 | ORAL_TABLET | Freq: Two times a day (BID) | ORAL | Status: DC

## 2012-09-30 NOTE — ED Provider Notes (Signed)
Medical screening examination/treatment/procedure(s) were performed by resident physician or non-physician practitioner and as supervising physician I was immediately available for consultation/collaboration.   Ayani Ospina DOUGLAS MD.   Juneau Doughman D Siria Calandro, MD 09/30/12 1352 

## 2012-09-30 NOTE — ED Notes (Signed)
Wound recheck - from drained abscess under right axilla  - pt states that she is still having pain that radiates into breast and is having foul smelling discharge.

## 2012-09-30 NOTE — ED Provider Notes (Signed)
History     CSN: [id number]  Arrival date & time 09/30/12  1042   None     Chief Complaint  Patient presents with  . Wound Infection    (Consider location/radiation/quality/duration/timing/severity/associated sxs/prior treatment) HPI Comments: 42 year old female was here 3 days ago for an abscess in the left axilla. His abscess was incised and drained and packing was inserted. She is here today for wound check and packing removal. She is complaining of occasional sharp pains that radiate into the breast. There was initial large amount of drainage postop but has decreased. She states she has noticed a malodorous discharge.    Past Medical History  Diagnosis Date  . Hypertension   . Asthma     controlled-last used rescue inhaler 3 months ago  . Headache   . GERD (gastroesophageal reflux disease)     zantac/pepcid prn  . Arthritis     degenerative disc disease-work related injury  . Fibroids   . High cholesterol   . Pre-eclampsia   . Pre-eclampsia     Past Surgical History  Procedure Laterality Date  . Wisdom tooth extraction    . Spinal fusion  2006    L4-5  . Cesarean section  2011  . Fibroidectomy      Family History  Problem Relation Age of Onset  . Hypertension Mother   . High Cholesterol Mother   . Pneumonia Father   . Liver disease Father   . Hypertension Other   . Stroke Other   . High Cholesterol Other   . Breast cancer Maternal Aunt   . Pancreatic cancer Maternal Uncle     History  Substance Use Topics  . Smoking status: Never Smoker   . Smokeless tobacco: Never Used  . Alcohol Use: No    OB History   Grav Para Term Preterm Abortions TAB SAB Ect Mult Living   1 1        1       Review of Systems  All other systems reviewed and are negative.    Allergies  Aspirin; Methocarbamol; Morphine and related; and Percocet  Home Medications   Current Outpatient Rx  Name  Route  Sig  Dispense  Refill  . acetaminophen-codeine (TYLENOL #3)  300-30 MG per tablet   Oral   Take 1-2 tablets by mouth every 6 (six) hours as needed for pain.   30 tablet   1   . albuterol (PROVENTIL HFA;VENTOLIN HFA) 108 (90 BASE) MCG/ACT inhaler   Inhalation   Inhale 1-2 puffs into the lungs every 6 (six) hours as needed. For wheezing         . amoxicillin (AMOXIL) 500 MG capsule   Oral   Take 1 capsule (500 mg total) by mouth 3 (three) times daily.   30 capsule   0   . antipyrine-benzocaine (AURALGAN) otic solution   Right Ear   Place 3 drops into the right ear every 2 (two) hours as needed for pain.   10 mL   0   . CAMILA 0.35 MG tablet      TAKE ONE TABLET BY MOUTH ONE TIME DAILY   1 each   PRN   . desonide (DESOWEN) 0.05 % cream   Topical   Apply 1 application topically 2 (two) times daily as needed. For itching         . fluticasone (FLONASE) 50 MCG/ACT nasal spray   Nasal   Place 1 spray into the nose daily as needed.  For allergies         . hydrochlorothiazide (HYDRODIURIL) 25 MG tablet   Oral   Take 12.5 mg by mouth daily.         Marland Kitchen HYDROcodone-acetaminophen (NORCO/VICODIN) 5-325 MG per tablet   Oral   Take 2 tablets by mouth every 4 (four) hours as needed for pain.   10 tablet   0   . labetalol (NORMODYNE) 200 MG tablet   Oral   Take 200 mg by mouth 2 (two) times daily.         . montelukast (SINGULAIR) 10 MG tablet   Oral   Take 10 mg by mouth at bedtime.         . norethindrone (MICRONOR,CAMILA,ERRIN) 0.35 MG tablet   Oral   Take 1 tablet (0.35 mg total) by mouth daily.   1 Package   11   . nystatin-triamcinolone (MYCOLOG II) cream      Apply to affected area (behind both ears) TID x 10 days   15 g   0   . potassium chloride SA (K-DUR,KLOR-CON) 20 MEQ tablet   Oral   Take 20 mEq by mouth daily.         . simvastatin (ZOCOR) 40 MG tablet   Oral   Take 40 mg by mouth every evening.         . sulfamethoxazole-trimethoprim (SEPTRA DS) 800-160 MG per tablet   Oral   Take 1 tablet  by mouth 2 (two) times daily. X 5 days   10 tablet   0   . tobramycin (TOBREX) 0.3 % ophthalmic solution   Left Eye   Place 1 drop into the left eye every 4 (four) hours.   5 mL   0     BP 132/85  Pulse 80  Temp(Src) 98.3 F (36.8 C) (Oral)  SpO2 98%  Physical Exam  Nursing note and vitals reviewed. Constitutional: She is oriented to person, place, and time. She appears well-developed and well-nourished. No distress.  Neck: Normal range of motion. Neck supple.  Cardiovascular: Normal rate.   Pulmonary/Chest: Effort normal.  Neurological: She is alert and oriented to person, place, and time.  Skin: Skin is warm and dry.  Psychiatric: She has a normal mood and affect.    ED Course  Wound repair Date/Time: 09/30/2012 12:40 PM Performed by: Phineas Real, Daun Rens Authorized by: Phineas Real, Cristobal Advani Consent: Verbal consent obtained. Risks and benefits: risks, benefits and alternatives were discussed Consent given by: patient Patient understanding: patient states understanding of the procedure being performed Patient identity confirmed: verbally with patient Local anesthesia used: no Patient sedated: no Patient tolerance: Patient tolerated the procedure well with no immediate complications. Comments: The packing was removed, wound cleaned and reinserted approximately 4 cm of packing. A dressing was then applied.   (including critical care time)  Labs Reviewed - No data to display No results found.   1. Abscess of axilla, left   2. Encounter for postoperative wound care       MDM  The wound is healing well. No purulence is expressed. There is mild leading no surrounding erythema. The culture was reviewed and is growing out staph aureus. She was placed on antibiotic and is requesting one. She started on Septra DS one twice a day for 5 days. She may remove the packing in 2 days. She does not have to return or she is having problems.         Hayden Rasmussen, NP 09/30/12 1243

## 2012-10-01 LAB — WOUND CULTURE: Gram Stain: NONE SEEN

## 2012-10-02 ENCOUNTER — Telehealth (HOSPITAL_COMMUNITY): Payer: Self-pay | Admitting: *Deleted

## 2012-10-02 NOTE — ED Notes (Signed)
Abscess culture R arm: Abundant MRSA.  Pt. adequately treated with Septra DS on 2/11. 2/13  I called pt. and left a message to call. Vassie Moselle 10/02/2012

## 2012-10-07 ENCOUNTER — Telehealth (HOSPITAL_COMMUNITY): Payer: Self-pay | Admitting: *Deleted

## 2012-10-08 ENCOUNTER — Telehealth (HOSPITAL_COMMUNITY): Payer: Self-pay | Admitting: *Deleted

## 2012-10-08 NOTE — ED Notes (Signed)
Pt. called back.  Pt. verified x 2 and given results.  Pt. told she was adequately treated with the Septra. Pt.'s questions answered.  Pt. given [facility] MRSA instructions and voiced understanding.

## 2012-11-24 DIAGNOSIS — E785 Hyperlipidemia, unspecified: Secondary | ICD-10-CM | POA: Insufficient documentation

## 2013-08-12 ENCOUNTER — Encounter (HOSPITAL_COMMUNITY): Payer: Self-pay | Admitting: Emergency Medicine

## 2013-08-12 ENCOUNTER — Emergency Department (INDEPENDENT_AMBULATORY_CARE_PROVIDER_SITE_OTHER)
Admission: EM | Admit: 2013-08-12 | Discharge: 2013-08-12 | Disposition: A | Discharge status: Home / Self Care | Payer: Medicaid Other | Source: Home / Self Care

## 2013-08-12 DIAGNOSIS — M791 Myalgia, unspecified site: Secondary | ICD-10-CM

## 2013-08-12 DIAGNOSIS — J069 Acute upper respiratory infection, unspecified: Secondary | ICD-10-CM

## 2013-08-12 DIAGNOSIS — IMO0001 Reserved for inherently not codable concepts without codable children: Secondary | ICD-10-CM

## 2013-08-12 MED ORDER — KETOROLAC TROMETHAMINE 60 MG/2ML IM SOLN
INTRAMUSCULAR | Status: AC
  Filled 2013-08-12: qty 2

## 2013-08-12 MED ORDER — KETOROLAC TROMETHAMINE 30 MG/ML IJ SOLN
30.0000 mg | Freq: Once | INTRAMUSCULAR | Status: AC
  Administered 2013-08-12: 30 mg via INTRAMUSCULAR

## 2013-08-12 MED ORDER — TRAMADOL HCL 50 MG PO TABS: 50.0000 mg | ORAL_TABLET | Freq: Four times a day (QID) | ORAL | Status: DC | PRN

## 2013-08-12 NOTE — ED Provider Notes (Signed)
CSN: [id number]     Arrival date & time 08/12/13  1112 History   First MD Initiated Contact with Patient 08/12/13 1241     Chief Complaint  Patient presents with  . Generalized Body Aches   (Consider location/radiation/quality/duration/timing/severity/associated sxs/prior Treatment) HPI Comments: 42 year old female presents with "pain all over to include the lower chest, muscles of the neck, upper and lower back and legs. She also has a scratchy throat and a headache.   Past Medical History  Diagnosis Date  . Hypertension   . Asthma     controlled-last used rescue inhaler 3 months ago  . Headache(784.0)   . GERD (gastroesophageal reflux disease)     zantac/pepcid prn  . Arthritis     degenerative disc disease-work related injury  . Fibroids   . High cholesterol   . Pre-eclampsia   . Pre-eclampsia    Past Surgical History  Procedure Laterality Date  . Wisdom tooth extraction    . Spinal fusion  2006    L4-5  . Cesarean section  2011  . Fibroidectomy     Family History  Problem Relation Age of Onset  . Hypertension Mother   . High Cholesterol Mother   . Pneumonia Father   . Liver disease Father   . Hypertension Other   . Stroke Other   . High Cholesterol Other   . Breast cancer Maternal Aunt   . Pancreatic cancer Maternal Uncle    History  Substance Use Topics  . Smoking status: Never Smoker   . Smokeless tobacco: Never Used  . Alcohol Use: No   OB History   Grav Para Term Preterm Abortions TAB SAB Ect Mult Living   1 1        1      Review of Systems  Constitutional: Negative for fever, chills, activity change, appetite change and fatigue.  HENT: Positive for postnasal drip and sore throat. Negative for congestion, facial swelling and rhinorrhea.   Eyes: Negative.   Respiratory: Negative.  Negative for shortness of breath and wheezing.   Cardiovascular: Negative.   Genitourinary: Negative.   Musculoskeletal: Negative for neck pain and neck stiffness.   Skin: Negative for pallor and rash.  Neurological: Negative.     Allergies  Aspirin; Methocarbamol; Morphine and related; and Percocet  Home Medications   Current Outpatient Rx  Name  Route  Sig  Dispense  Refill  . acetaminophen-codeine (TYLENOL #3) 300-30 MG per tablet   Oral   Take 1-2 tablets by mouth every 6 (six) hours as needed for pain.   30 tablet   1   . albuterol (PROVENTIL HFA;VENTOLIN HFA) 108 (90 BASE) MCG/ACT inhaler   Inhalation   Inhale 1-2 puffs into the lungs every 6 (six) hours as needed. For wheezing         . amoxicillin (AMOXIL) 500 MG capsule   Oral   Take 1 capsule (500 mg total) by mouth 3 (three) times daily.   30 capsule   0   . antipyrine-benzocaine (AURALGAN) otic solution   Right Ear   Place 3 drops into the right ear every 2 (two) hours as needed for pain.   10 mL   0   . CAMILA 0.35 MG tablet      TAKE ONE TABLET BY MOUTH ONE TIME DAILY   1 each   PRN   . desonide (DESOWEN) 0.05 % cream   Topical   Apply 1 application topically 2 (two) times daily as needed.  For itching         . fluticasone (FLONASE) 50 MCG/ACT nasal spray   Nasal   Place 1 spray into the nose daily as needed. For allergies         . hydrochlorothiazide (HYDRODIURIL) 25 MG tablet   Oral   Take 12.5 mg by mouth daily.         Marland Kitchen HYDROcodone-acetaminophen (NORCO/VICODIN) 5-325 MG per tablet   Oral   Take 2 tablets by mouth every 4 (four) hours as needed for pain.   10 tablet   0   . labetalol (NORMODYNE) 200 MG tablet   Oral   Take 200 mg by mouth 2 (two) times daily.         . montelukast (SINGULAIR) 10 MG tablet   Oral   Take 10 mg by mouth at bedtime.         . norethindrone (MICRONOR,CAMILA,ERRIN) 0.35 MG tablet   Oral   Take 1 tablet (0.35 mg total) by mouth daily.   1 Package   11   . nystatin-triamcinolone (MYCOLOG II) cream      Apply to affected area (behind both ears) TID x 10 days   15 g   0   . potassium chloride SA  (K-DUR,KLOR-CON) 20 MEQ tablet   Oral   Take 20 mEq by mouth daily.         . simvastatin (ZOCOR) 40 MG tablet   Oral   Take 40 mg by mouth every evening.         . sulfamethoxazole-trimethoprim (SEPTRA DS) 800-160 MG per tablet   Oral   Take 1 tablet by mouth 2 (two) times daily. X 5 days   10 tablet   0   . tobramycin (TOBREX) 0.3 % ophthalmic solution   Left Eye   Place 1 drop into the left eye every 4 (four) hours.   5 mL   0   . traMADol (ULTRAM) 50 MG tablet   Oral   Take 1 tablet (50 mg total) by mouth every 6 (six) hours as needed.   15 tablet   0    BP 114/76  Pulse 91  Temp(Src) 98.4 F (36.9 C) (Oral)  Resp 18  SpO2 96% Physical Exam  Nursing note and vitals reviewed. Constitutional: She is oriented to person, place, and time. She appears well-developed and well-nourished. No distress.  HENT:  Bilateral TMs are normal Oropharynx with minor erythema and clear PND. No swelling or exudates.  Eyes: Conjunctivae and EOM are normal.  Neck: Normal range of motion. Neck supple.  Tenderness to the paracervical muscles. Full range of motion  Cardiovascular: Normal rate, regular rhythm and normal heart sounds.   Pulmonary/Chest: Effort normal and breath sounds normal. No respiratory distress. She has no wheezes. She has no rales.  Musculoskeletal: She exhibits no edema.  Mild tenderness to the lower and upper back musculature, if thigh and lower leg muscles. No bony tenderness.  Lymphadenopathy:    She has no cervical adenopathy.  Neurological: She is alert and oriented to person, place, and time.  Skin: Skin is warm and dry.  Psychiatric: She has a normal mood and affect.    ED Course  Procedures (including critical care time) Labs Review Labs Reviewed - No data to display Imaging Review No results found.    MDM   1. URI (upper respiratory infection)   2. Myalgia       Having myalgias that may be associated with  a viral illness but more  likely due to statin. She appears generally well. Alka seltzer plus Tramadol 50mg  Toradol 30 mg IM See PCP in 1-2 weeks  Hayden Rasmussen, NP 08/12/13 1318

## 2013-08-12 NOTE — ED Notes (Signed)
Pt c/o generalized BA onset yest w/clogged ears, nasal congestion, HA, ST.... Believes it maybe the flu Denies: f/v/n/d, SOB, wheezing She is alert w/no signs of acute distress.

## 2013-08-14 NOTE — ED Provider Notes (Signed)
Medical screening examination/treatment/procedure(s) were performed by a resident physician or non-physician practitioner and as the supervising physician I was immediately available for consultation/collaboration.  Evan Corey, MD    Evan S Corey, MD 08/14/13 1712 

## 2014-06-21 ENCOUNTER — Encounter (HOSPITAL_COMMUNITY): Payer: Self-pay | Admitting: Emergency Medicine

## 2014-08-01 ENCOUNTER — Emergency Department (HOSPITAL_COMMUNITY)
Admission: EM | Admit: 2014-08-01 | Discharge: 2014-08-01 | Disposition: A | Discharge status: Home / Self Care | Payer: Medicaid Other | Attending: Emergency Medicine | Admitting: Emergency Medicine

## 2014-08-01 ENCOUNTER — Encounter (HOSPITAL_COMMUNITY): Payer: Self-pay | Admitting: Family Medicine

## 2014-08-01 ENCOUNTER — Emergency Department (HOSPITAL_COMMUNITY): Payer: Medicaid Other

## 2014-08-01 DIAGNOSIS — M1990 Unspecified osteoarthritis, unspecified site: Secondary | ICD-10-CM | POA: Diagnosis not present

## 2014-08-01 DIAGNOSIS — Z79899 Other long term (current) drug therapy: Secondary | ICD-10-CM | POA: Diagnosis not present

## 2014-08-01 DIAGNOSIS — K219 Gastro-esophageal reflux disease without esophagitis: Secondary | ICD-10-CM | POA: Diagnosis not present

## 2014-08-01 DIAGNOSIS — D259 Leiomyoma of uterus, unspecified: Secondary | ICD-10-CM | POA: Diagnosis not present

## 2014-08-01 DIAGNOSIS — N8320 Unspecified ovarian cysts: Secondary | ICD-10-CM | POA: Diagnosis not present

## 2014-08-01 DIAGNOSIS — Z3202 Encounter for pregnancy test, result negative: Secondary | ICD-10-CM | POA: Insufficient documentation

## 2014-08-01 DIAGNOSIS — R1030 Lower abdominal pain, unspecified: Secondary | ICD-10-CM

## 2014-08-01 DIAGNOSIS — Z792 Long term (current) use of antibiotics: Secondary | ICD-10-CM | POA: Diagnosis not present

## 2014-08-01 DIAGNOSIS — J45909 Unspecified asthma, uncomplicated: Secondary | ICD-10-CM | POA: Insufficient documentation

## 2014-08-01 DIAGNOSIS — N760 Acute vaginitis: Secondary | ICD-10-CM | POA: Diagnosis not present

## 2014-08-01 DIAGNOSIS — I10 Essential (primary) hypertension: Secondary | ICD-10-CM | POA: Insufficient documentation

## 2014-08-01 DIAGNOSIS — B9689 Other specified bacterial agents as the cause of diseases classified elsewhere: Secondary | ICD-10-CM

## 2014-08-01 DIAGNOSIS — Z7952 Long term (current) use of systemic steroids: Secondary | ICD-10-CM | POA: Insufficient documentation

## 2014-08-01 DIAGNOSIS — R1031 Right lower quadrant pain: Secondary | ICD-10-CM | POA: Diagnosis present

## 2014-08-01 DIAGNOSIS — R110 Nausea: Secondary | ICD-10-CM | POA: Insufficient documentation

## 2014-08-01 DIAGNOSIS — E780 Pure hypercholesterolemia: Secondary | ICD-10-CM | POA: Diagnosis not present

## 2014-08-01 DIAGNOSIS — N83201 Unspecified ovarian cyst, right side: Secondary | ICD-10-CM

## 2014-08-01 LAB — URINALYSIS, ROUTINE W REFLEX MICROSCOPIC
GLUCOSE, UA: NEGATIVE mg/dL
Ketones, ur: NEGATIVE mg/dL
LEUKOCYTES UA: NEGATIVE
Nitrite: NEGATIVE
PROTEIN: NEGATIVE mg/dL
SPECIFIC GRAVITY, URINE: 1.026 (ref 1.005–1.030)
Urobilinogen, UA: 0.2 mg/dL (ref 0.0–1.0)
pH: 5.5 (ref 5.0–8.0)

## 2014-08-01 LAB — CBC WITH DIFFERENTIAL/PLATELET
BASOS ABS: 0 10*3/uL (ref 0.0–0.1)
BASOS PCT: 1 % (ref 0–1)
EOS ABS: 0.2 10*3/uL (ref 0.0–0.7)
EOS PCT: 4 % (ref 0–5)
HCT: 35.9 % — ABNORMAL LOW (ref 36.0–46.0)
Hemoglobin: 11.9 g/dL — ABNORMAL LOW (ref 12.0–15.0)
LYMPHS ABS: 1.7 10*3/uL (ref 0.7–4.0)
Lymphocytes Relative: 39 % (ref 12–46)
MCH: 28.5 pg (ref 26.0–34.0)
MCHC: 33.1 g/dL (ref 30.0–36.0)
MCV: 86.1 fL (ref 78.0–100.0)
Monocytes Absolute: 0.3 10*3/uL (ref 0.1–1.0)
Monocytes Relative: 6 % (ref 3–12)
NEUTROS PCT: 50 % (ref 43–77)
Neutro Abs: 2.2 10*3/uL (ref 1.7–7.7)
PLATELETS: 225 10*3/uL (ref 150–400)
RBC: 4.17 MIL/uL (ref 3.87–5.11)
RDW: 12.1 % (ref 11.5–15.5)
WBC: 4.3 10*3/uL (ref 4.0–10.5)

## 2014-08-01 LAB — COMPREHENSIVE METABOLIC PANEL
ALBUMIN: 3.8 g/dL (ref 3.5–5.2)
ALK PHOS: 74 U/L (ref 39–117)
ALT: 23 U/L (ref 0–35)
AST: 29 U/L (ref 0–37)
Anion gap: 12 (ref 5–15)
BUN: 10 mg/dL (ref 6–23)
CALCIUM: 9.4 mg/dL (ref 8.4–10.5)
CO2: 26 mEq/L (ref 19–32)
Chloride: 100 mEq/L (ref 96–112)
Creatinine, Ser: 0.83 mg/dL (ref 0.50–1.10)
GFR calc non Af Amer: 85 mL/min — ABNORMAL LOW (ref >90)
GLUCOSE: 94 mg/dL (ref 70–99)
POTASSIUM: 3.7 meq/L (ref 3.7–5.3)
SODIUM: 138 meq/L (ref 137–147)
TOTAL PROTEIN: 7.5 g/dL (ref 6.0–8.3)
Total Bilirubin: 0.5 mg/dL (ref 0.3–1.2)

## 2014-08-01 LAB — WET PREP, GENITAL
TRICH WET PREP: NONE SEEN
Yeast Wet Prep HPF POC: NONE SEEN

## 2014-08-01 LAB — URINE MICROSCOPIC-ADD ON

## 2014-08-01 LAB — PREGNANCY, URINE: Preg Test, Ur: NEGATIVE

## 2014-08-01 MED ORDER — NAPROXEN 500 MG PO TABS: 500.0000 mg | ORAL_TABLET | Freq: Two times a day (BID) | ORAL | Status: DC | PRN

## 2014-08-01 MED ORDER — LIDOCAINE HCL (PF) 1 % IJ SOLN
0.9000 mL | Freq: Once | INTRAMUSCULAR | Status: AC
  Administered 2014-08-01: 0.9 mL

## 2014-08-01 MED ORDER — LIDOCAINE HCL (PF) 1 % IJ SOLN
INTRAMUSCULAR | Status: AC
  Filled 2014-08-01: qty 5

## 2014-08-01 MED ORDER — METRONIDAZOLE 500 MG PO TABS: 500.0000 mg | ORAL_TABLET | Freq: Two times a day (BID) | ORAL | Status: DC

## 2014-08-01 MED ORDER — KETOROLAC TROMETHAMINE 30 MG/ML IJ SOLN
30.0000 mg | Freq: Once | INTRAMUSCULAR | Status: AC
  Administered 2014-08-01: 30 mg via INTRAMUSCULAR
  Filled 2014-08-01: qty 1

## 2014-08-01 MED ORDER — HYDROCODONE-ACETAMINOPHEN 5-325 MG PO TABS: 1.0000 | ORAL_TABLET | Freq: Four times a day (QID) | ORAL | Status: DC | PRN

## 2014-08-01 MED ORDER — CEFTRIAXONE SODIUM 250 MG IJ SOLR
250.0000 mg | Freq: Once | INTRAMUSCULAR | Status: AC
  Administered 2014-08-01: 250 mg via INTRAMUSCULAR
  Filled 2014-08-01: qty 250

## 2014-08-01 MED ORDER — AZITHROMYCIN 250 MG PO TABS
1000.0000 mg | ORAL_TABLET | Freq: Once | ORAL | Status: AC
  Administered 2014-08-01: 1000 mg via ORAL
  Filled 2014-08-01: qty 4

## 2014-08-01 NOTE — ED Notes (Signed)
Pt in ultrasound

## 2014-08-01 NOTE — ED Provider Notes (Signed)
CSN: [id number]     Arrival date & time 08/01/14  1551 History   First MD Initiated Contact with Patient 08/01/14 1712     Chief Complaint  Patient presents with  . Abdominal Pain     (Consider location/radiation/quality/duration/timing/severity/associated sxs/prior Treatment) HPI Comments: [name] is a 43 y.o. female with a PMHx of HTN, asthma, chronic headaches, GERD, DDD, fibroids, and ovarian cysts, who presents to the ED with complaints of 2 weeks of ongoing right lateral abdominal pain which she reports is 10/10 constant burning/throbbing nonradiating pain worse with lying on her right side or her back, improved slightly with motivated, tramadol, and Flexeril. Associated symptoms include nausea and increased urinary frequency and urgency with a clear yellowish vaginal discharge. She reports that she has a history of fibroids and ovarian cysts and she is unsure if these are the source of the pain, and has not been able to get into in OB/GYN clinic due to insurance issues. She denies fevers, chills, chest pain, shortness of breath, vomiting, diarrhea, constipation, melena, hematochezia, dysuria, hematuria, malodorous urine, vaginal bleeding, vaginal itching, myalgias, arthralgias, cauda equina symptoms. She denies being sexually active in the last years. Her last menstrual period was 12/3-12/10, stating that she had a regular cycle which is heavy and slightly painful but this is normal for her menses. Denies any travel or alcohol use, sick contacts, or suspicious food intake. Is currently taking Micronor to help with her menstrual cycles.  Patient is a 43 y.o. female presenting with abdominal pain. The history is provided by the patient. No language interpreter was used.  Abdominal Pain Pain location:  RLQ Pain quality: burning and throbbing   Pain radiates to:  Does not radiate Pain severity:  Moderate Onset quality:  Gradual Duration:  2 weeks Timing:  Constant Progression:   Unchanged Chronicity:  Recurrent Context: not diet changes, not recent sexual activity, not recent travel, not sick contacts and not suspicious food intake   Relieved by:  NSAIDs (mobic, tramadol, and flexeril) Worsened by:  Position changes (laying on R side and back) Ineffective treatments:  None tried Associated symptoms: nausea and vaginal discharge (clear yellowish, thin)   Associated symptoms: no anorexia, no chest pain, no chills, no constipation, no diarrhea, no dysuria, no fever, no flatus, no hematemesis, no hematochezia, no hematuria, no melena, no shortness of breath, no vaginal bleeding and no vomiting     Past Medical History  Diagnosis Date  . Hypertension   . Asthma     controlled-last used rescue inhaler 3 months ago  . Headache(784.0)   . GERD (gastroesophageal reflux disease)     zantac/pepcid prn  . Arthritis     degenerative disc disease-work related injury  . Fibroids   . High cholesterol   . Pre-eclampsia   . Pre-eclampsia    Past Surgical History  Procedure Laterality Date  . Wisdom tooth extraction    . Spinal fusion  2006    L4-5  . Cesarean section  2011  . Fibroidectomy     Family History  Problem Relation Age of Onset  . Hypertension Mother   . High Cholesterol Mother   . Pneumonia Father   . Liver disease Father   . Hypertension Other   . Stroke Other   . High Cholesterol Other   . Breast cancer Maternal Aunt   . Pancreatic cancer Maternal Uncle    History  Substance Use Topics  . Smoking status: Never Smoker   . Smokeless tobacco:  Never Used  . Alcohol Use: No   OB History    Gravida Para Term Preterm AB TAB SAB Ectopic Multiple Living   1 1        1      Review of Systems  Constitutional: Negative for fever and chills.  Respiratory: Negative for shortness of breath.   Cardiovascular: Negative for chest pain.  Gastrointestinal: Positive for nausea and abdominal pain. Negative for vomiting, diarrhea, constipation, blood in  stool, melena, hematochezia, abdominal distention, anorexia, flatus and hematemesis.  Genitourinary: Positive for urgency, frequency and vaginal discharge (clear yellowish, thin). Negative for dysuria, hematuria, flank pain, decreased urine volume, vaginal bleeding, vaginal pain and menstrual problem.  Musculoskeletal: Negative for myalgias, back pain and arthralgias.  Skin: Negative for rash.  Neurological: Negative for dizziness, weakness and numbness.  Hematological: Does not bruise/bleed easily.  Psychiatric/Behavioral: Negative for confusion.   10 Systems reviewed and are negative for acute change except as noted in the HPI.    Allergies  Aspirin; Methocarbamol; Morphine and related; and Percocet  Home Medications   Prior to Admission medications   Medication Sig Start Date End Date Taking? Authorizing Provider  acetaminophen-codeine (TYLENOL #3) 300-30 MG per tablet Take 1-2 tablets by mouth every 6 (six) hours as needed for pain. 08/04/12   Delice Lesch, MD  albuterol (PROVENTIL HFA;VENTOLIN HFA) 108 (90 BASE) MCG/ACT inhaler Inhale 1-2 puffs into the lungs every 6 (six) hours as needed. For wheezing    Historical Provider, MD  amoxicillin (AMOXIL) 500 MG capsule Take 1 capsule (500 mg total) by mouth 3 (three) times daily. 07/23/12   Fransico Meadow, PA-C  antipyrine-benzocaine Toniann Fail) otic solution Place 3 drops into the right ear every 2 (two) hours as needed for pain. 07/23/12   Fransico Meadow, PA-C  CAMILA 0.35 MG tablet TAKE ONE TABLET BY MOUTH ONE TIME DAILY 06/29/12   Delsa Bern, MD  desonide (DESOWEN) 0.05 % cream Apply 1 application topically 2 (two) times daily as needed. For itching    Historical Provider, MD  fluticasone (FLONASE) 50 MCG/ACT nasal spray Place 1 spray into the nose daily as needed. For allergies    Historical Provider, MD  hydrochlorothiazide (HYDRODIURIL) 25 MG tablet Take 12.5 mg by mouth daily.    Historical Provider, MD   HYDROcodone-acetaminophen (NORCO/VICODIN) 5-325 MG per tablet Take 2 tablets by mouth every 4 (four) hours as needed for pain. 07/23/12   Fransico Meadow, PA-C  labetalol (NORMODYNE) 200 MG tablet Take 200 mg by mouth 2 (two) times daily.    Historical Provider, MD  montelukast (SINGULAIR) 10 MG tablet Take 10 mg by mouth at bedtime.    Historical Provider, MD  norethindrone (MICRONOR,CAMILA,ERRIN) 0.35 MG tablet Take 1 tablet (0.35 mg total) by mouth daily. 08/04/12   Delice Lesch, MD  nystatin-triamcinolone (MYCOLOG II) cream Apply to affected area (behind both ears) TID x 10 days 09/27/12   Rhetta Mura Schorr, NP  potassium chloride SA (K-DUR,KLOR-CON) 20 MEQ tablet Take 20 mEq by mouth daily.    Historical Provider, MD  simvastatin (ZOCOR) 40 MG tablet Take 40 mg by mouth every evening.    Historical Provider, MD  sulfamethoxazole-trimethoprim (SEPTRA DS) 800-160 MG per tablet Take 1 tablet by mouth 2 (two) times daily. X 5 days 09/30/12   Janne Napoleon, NP  tobramycin (TOBREX) 0.3 % ophthalmic solution Place 1 drop into the left eye every 4 (four) hours. 07/23/12   Fransico Meadow, PA-C  traMADol Veatrice Bourbon)  50 MG tablet Take 1 tablet (50 mg total) by mouth every 6 (six) hours as needed. 08/12/13   Janne Napoleon, NP   BP 152/89 mmHg  Pulse 83  Temp(Src) 98.3 F (36.8 C)  Resp 18  SpO2 98% Physical Exam  Constitutional: She is oriented to person, place, and time. Vital signs are normal. She appears well-developed and well-nourished.  Non-toxic appearance. No distress.  Afebrile, nontoxic, NAD  HENT:  Head: Normocephalic and atraumatic.  Mouth/Throat: Oropharynx is clear and moist and mucous membranes are normal.  Eyes: Conjunctivae and EOM are normal. Right eye exhibits no discharge. Left eye exhibits no discharge.  Neck: Normal range of motion. Neck supple.  Cardiovascular: Normal rate, regular rhythm, normal heart sounds and intact distal pulses.  Exam reveals no gallop and no friction rub.    No murmur heard. Pulmonary/Chest: Effort normal and breath sounds normal. No respiratory distress. She has no decreased breath sounds. She has no wheezes. She has no rhonchi. She has no rales.  Abdominal: Soft. Normal appearance and bowel sounds are normal. She exhibits no distension. There is no tenderness. There is no rigidity, no rebound, no guarding, no CVA tenderness, no tenderness at McBurney's point and negative Murphy's sign.  Soft, NTND, +BS throughout, no r/g/r, neg murphy's, neg mcburney's, no CVA TTP, no palpable uterus  Genitourinary: Uterus normal. Pelvic exam was performed with patient supine. There is no rash, tenderness or lesion on the right labia. There is no rash, tenderness or lesion on the left labia. Cervix exhibits discharge (yellowish mucoid) and friability. Cervix exhibits no motion tenderness. Right adnexum displays no mass, no tenderness and no fullness. Left adnexum displays tenderness. Left adnexum displays no mass and no fullness. No erythema, tenderness or bleeding in the vagina. Vaginal discharge (yellowish mucoid) found.  No rashes, lesions, or tenderness to external genitalia. No erythema, injury, or tenderness to vaginal mucosa. Scant yellowish mucoid vaginal discharge in vault, no bleeding within vaginal vault. No adnexal masses or fullness with mild L adnexal tenderness. No CMT. Mild cervical friability and yellowish mucoid discharge from cervical os. Uterus non-deviated, mobile, nonTTP, and without enlargement.    Musculoskeletal: Normal range of motion.  MAE x4 Strength 5/5 in all extremities Sensation grossly intact in all extremities All spinal levels nonTTP with deformities  Neurological: She is alert and oriented to person, place, and time. She has normal strength. No sensory deficit. Gait normal.  Skin: Skin is warm, dry and intact. No rash noted.  Psychiatric: She has a normal mood and affect.  Nursing note and vitals reviewed.   ED Course  Procedures  (including critical care time) Labs Review Labs Reviewed  WET PREP, GENITAL - Abnormal; Notable for the following:    Clue Cells Wet Prep HPF POC FEW (*)    WBC, Wet Prep HPF POC FEW (*)    All other components within normal limits  CBC WITH DIFFERENTIAL - Abnormal; Notable for the following:    Hemoglobin 11.9 (*)    HCT 35.9 (*)    All other components within normal limits  COMPREHENSIVE METABOLIC PANEL - Abnormal; Notable for the following:    GFR calc non Af Amer 85 (*)    All other components within normal limits  URINALYSIS, ROUTINE W REFLEX MICROSCOPIC - Abnormal; Notable for the following:    APPearance CLOUDY (*)    Hgb urine dipstick TRACE (*)    Bilirubin Urine SMALL (*)    All other components within normal limits  URINE  MICROSCOPIC-ADD ON - Abnormal; Notable for the following:    Squamous Epithelial / LPF FEW (*)    All other components within normal limits  GC/CHLAMYDIA PROBE AMP  PREGNANCY, URINE    Imaging Review US Transvaginal Non-ob  08/01/2014   CLINICAL DATA:  Right adnexal tenderness. LMP 07/22/2014. Initial encounter.  EXAM: TRANSABDOMINAL AND TRANSVAGINAL ULTRASOUND OF PELVIS  TECHNIQUE: Both transabdominal and transvaginal ultrasound examinations of the pelvis were performed. Transabdominal technique was performed for global imaging of the pelvis including uterus, ovaries, adnexal regions, and pelvic cul-de-sac. It was necessary to proceed with endovaginal exam following the transabdominal exam to visualize the endometrium and ovaries to better advantage.  COMPARISON:  Abdominal pelvic CT 04/23/2004.  Lumbar MRI 03/02/2014.  FINDINGS: Uterus  Measurements: 9.5 x 5.4 x 5.5 cm. The myometrium is diffusely heterogeneous, consistent with multiple fibroids. The largest is a submucosal lesion measuring approximately 2.7 x 2.4 x 2.9 cm  Endometrium  Thickness: Indeterminate. The endometrium is distorted by the fibroids. Endometrial thickening cannot be excluded,  although suggested thickening may be related to a submucosal fibroid.  Right ovary  Measurements: 4.5 x 3.1 x 3.6 cm. There is a simple cyst measuring 3.7 x 2.3 x 3.1 cm. There is normal peripheral blood flow in the ovary with color Doppler.  Left ovary  Measurements: 3.2 x 2.0 x 2.4 cm. Normal appearance/no adnexal mass. Normal blood flow with color Doppler.  Other findings  Trace free pelvic fluid  IMPRESSION: 1. Simple right ovarian cyst. There is normal blood flow within the right ovary on color Doppler. 2. Uterine fibroids. The endometrium is distorted and not optimally measured. If the patient has abnormal vaginal bleeding, follow up ultrasound recommended.   Electronically Signed   By: Camie Patience M.D.   On: 08/01/2014 20:01   US Pelvis Complete  08/01/2014   CLINICAL DATA:  Right adnexal tenderness. LMP 07/22/2014. Initial encounter.  EXAM: TRANSABDOMINAL AND TRANSVAGINAL ULTRASOUND OF PELVIS  TECHNIQUE: Both transabdominal and transvaginal ultrasound examinations of the pelvis were performed. Transabdominal technique was performed for global imaging of the pelvis including uterus, ovaries, adnexal regions, and pelvic cul-de-sac. It was necessary to proceed with endovaginal exam following the transabdominal exam to visualize the endometrium and ovaries to better advantage.  COMPARISON:  Abdominal pelvic CT 04/23/2004.  Lumbar MRI 03/02/2014.  FINDINGS: Uterus  Measurements: 9.5 x 5.4 x 5.5 cm. The myometrium is diffusely heterogeneous, consistent with multiple fibroids. The largest is a submucosal lesion measuring approximately 2.7 x 2.4 x 2.9 cm  Endometrium  Thickness: Indeterminate. The endometrium is distorted by the fibroids. Endometrial thickening cannot be excluded, although suggested thickening may be related to a submucosal fibroid.  Right ovary  Measurements: 4.5 x 3.1 x 3.6 cm. There is a simple cyst measuring 3.7 x 2.3 x 3.1 cm. There is normal peripheral blood flow in the ovary with color  Doppler.  Left ovary  Measurements: 3.2 x 2.0 x 2.4 cm. Normal appearance/no adnexal mass. Normal blood flow with color Doppler.  Other findings  Trace free pelvic fluid  IMPRESSION: 1. Simple right ovarian cyst. There is normal blood flow within the right ovary on color Doppler. 2. Uterine fibroids. The endometrium is distorted and not optimally measured. If the patient has abnormal vaginal bleeding, follow up ultrasound recommended.   Electronically Signed   By: Camie Patience M.D.   On: 08/01/2014 20:01     EKG Interpretation None      MDM   Final diagnoses:  Lower abdominal pain  BV (bacterial vaginosis)  Uterine leiomyoma, unspecified location  Cyst of right ovary    43 y.o. female with RLQ/R lateral abd pain x2 weeks but cannot be elicited on palpation. CBC w/diff with baseline anemia. CMP WNL. Upreg neg. U/A without signs of infection. Will perform pelvic and give toradol IM and reassess. Sounds like ovarian/uterine pain. If concerning pelvic, will obtain U/S.  7:11 PM Pelvic with mild L adnexal tenderness, will proceed with U/S. Will empirically treat for GC/CT given her discharge. Will reassess shortly.  8:57 PM Wet prep with few clue cells, given her vaginal discharge will tx for BV. U/S reveals simple cysts and fibroids, will have her f/up with PCP who is currently working on her getting to a new OBGYN and send home on naprosyn and small amount of norco. Pain improved slightly after toradol, pt agrees with scripts for home instead of giving sedating meds here. I explained the diagnosis and have given explicit precautions to return to the ER including for any other new or worsening symptoms. The patient understands and accepts the medical plan as it's been dictated and I have answered their questions. Discharge instructions concerning home care and prescriptions have been given. The patient is STABLE and is discharged to home in good condition.   BP 152/89 mmHg  Pulse 83  Temp(Src)  98.3 F (36.8 C)  Resp 18  SpO2 98%  Meds ordered this encounter  Medications  . ketorolac (TORADOL) 30 MG/ML injection 30 mg    Sig:   . azithromycin (ZITHROMAX) tablet 1,000 mg    Sig:    And  . cefTRIAXone (ROCEPHIN) injection 250 mg    Sig:     Order Specific Question:  Antibiotic Indication:    Answer:  STD  . lidocaine (PF) (XYLOCAINE) 1 % injection    Sig:     Mumford, Jessica   : cabinet override  . lidocaine (PF) (XYLOCAINE) 1 % injection 0.9 mL    Sig:   . naproxen (NAPROSYN) 500 MG tablet    Sig: Take 1 tablet (500 mg total) by mouth 2 (two) times daily as needed for mild pain, moderate pain or headache (TAKE WITH MEALS.).    Dispense:  20 tablet    Refill:  0    Order Specific Question:  Supervising Provider    Answer:  Noemi Chapel D [[id number]]  . HYDROcodone-acetaminophen (NORCO) 5-325 MG per tablet    Sig: Take 1 tablet by mouth every 6 (six) hours as needed for severe pain.    Dispense:  6 tablet    Refill:  0    Order Specific Question:  Supervising Provider    Answer:  Noemi Chapel D [[id number]]  . metroNIDAZOLE (FLAGYL) 500 MG tablet    Sig: Take 1 tablet (500 mg total) by mouth 2 (two) times daily. One po bid x 7 days    Dispense:  14 tablet    Refill:  0    Order Specific Question:  Supervising Provider    Answer:  Noemi Chapel D [place], PA-C 08/01/14 2103  Richarda Blade, MD 08/01/14 [phone]

## 2014-08-01 NOTE — ED Notes (Signed)
Pt contact [phone] Truett Perna

## 2014-08-01 NOTE — Discharge Instructions (Signed)
Abdominal (belly) pain can be caused by many things. Your caregiver performed an examination and possibly ordered blood/urine tests and imaging (CT scan, x-rays, ultrasound). Many cases can be observed and treated at home after initial evaluation in the emergency department. Even though you are being discharged home, abdominal pain can be unpredictable. Therefore, you need a repeated exam if your pain does not resolve, returns, or worsens. Most patients with abdominal pain don't have to be admitted to the hospital or have surgery, but serious problems like appendicitis and gallbladder attacks can start out as nonspecific pain. Many abdominal conditions cannot be diagnosed in one visit, so follow-up evaluations are very important. Your abdominal pain seems to be related to your fibroids and ovarian cysts. Use naprosyn and norco as directed as needed for pain, but don't drive while taking norco. Take flagyl as directed for bacterial vaginosis. Follow up with your primary doctor for a referral to OBGYN. Return to the ER for changes or worsening symptoms. SEEK IMMEDIATE MEDICAL ATTENTION IF YOU DEVELOP ANY OF THE FOLLOWING SYMPTOMS:  The pain does not go away or becomes severe.   A temperature above 101 develops.   Repeated vomiting occurs (multiple episodes).   The pain becomes localized to portions of the abdomen. The right side could possibly be appendicitis. In an adult, the left lower portion of the abdomen could be colitis or diverticulitis.   Blood is being passed in stools or vomit (bright red or black tarry stools).   Return also if you develop chest pain, difficulty breathing, dizziness or fainting, or become confused, poorly responsive, or inconsolable (young children).  The constipation stays for more than 4 days.   There is belly (abdominal) or rectal pain.   You do not seem to be getting better.     Abdominal Pain Many things can cause abdominal pain. Usually, abdominal pain is not  caused by a disease and will improve without treatment. It can often be observed and treated at home. Your health care provider will do a physical exam and possibly order blood tests and X-rays to help determine the seriousness of your pain. However, in many cases, more time must pass before a clear cause of the pain can be found. Before that point, your health care provider may not know if you need more testing or further treatment. HOME CARE INSTRUCTIONS  Monitor your abdominal pain for any changes. The following actions may help to alleviate any discomfort you are experiencing:  Only take over-the-counter or prescription medicines as directed by your health care provider.  Do not take laxatives unless directed to do so by your health care provider.  Try a clear liquid diet (broth, tea, or water) as directed by your health care provider. Slowly move to a bland diet as tolerated. SEEK MEDICAL CARE IF:  You have unexplained abdominal pain.  You have abdominal pain associated with nausea or diarrhea.  You have pain when you urinate or have a bowel movement.  You experience abdominal pain that wakes you in the night.  You have abdominal pain that is worsened or improved by eating food.  You have abdominal pain that is worsened with eating fatty foods.  You have a fever. SEEK IMMEDIATE MEDICAL CARE IF:   Your pain does not go away within 2 hours.  You keep throwing up (vomiting).  Your pain is felt only in portions of the abdomen, such as the right side or the left lower portion of the abdomen.  You pass  bloody or black tarry stools. MAKE SURE YOU:  Understand these instructions.   Will watch your condition.   Will get help right away if you are not doing well or get worse.  Document Released: 05/16/2005 Document Revised: 08/11/2013 Document Reviewed: 04/15/2013 [facility] Patient Information 2015 [place], [place]. This information is not intended to replace advice given to you  by your health care provider. Make sure you discuss any questions you have with your health care provider.  Bacterial Vaginosis Bacterial vaginosis is an infection of the vagina. It happens when too many of certain germs (bacteria) grow in the vagina. HOME CARE  Take your medicine as told by your doctor.  Finish your medicine even if you start to feel better.  Do not have sex until you finish your medicine and are better.  Tell your sex partner that you have an infection. They should see their doctor for treatment.  Practice safe sex. Use condoms. Have only one sex partner. GET HELP IF:  You are not getting better after 3 days of treatment.  You have more grey fluid (discharge) coming from your vagina than before.  You have more pain than before.  You have a fever. MAKE SURE YOU:   Understand these instructions.  Will watch your condition.  Will get help right away if you are not doing well or get worse. Document Released: 05/15/2008 Document Revised: 05/27/2013 Document Reviewed: 03/18/2013 [facility] Patient Information 2015 [place], [place]. This information is not intended to replace advice given to you by your health care provider. Make sure you discuss any questions you have with your health care provider.  Fibroids Fibroids are lumps (tumors) that can occur any place in a woman's body. These lumps are not cancerous. Fibroids vary in size, weight, and where they grow. HOME CARE  Do not take aspirin.  Write down the number of pads or tampons you use during your period. Tell your doctor. This can help determine the best treatment for you. GET HELP RIGHT AWAY IF:  You have pain in your lower belly (abdomen) that is not helped with medicine.  You have cramps that are not helped with medicine.  You have more bleeding between or during your period.  You feel lightheaded or pass out (faint).  Your lower belly pain gets worse. MAKE SURE YOU:  Understand these  instructions.  Will watch your condition.  Will get help right away if you are not doing well or get worse. Document Released: 09/08/2010 Document Revised: 10/29/2011 Document Reviewed: 09/08/2010 [facility] Patient Information 2015 [place], [place]. This information is not intended to replace advice given to you by your health care provider. Make sure you discuss any questions you have with your health care provider.

## 2014-08-01 NOTE — ED Notes (Signed)
Pt alert x4 respirations easy. rx given for pain medication.

## 2014-08-01 NOTE — ED Notes (Signed)
Per pt sts 2 weeks of RLQ/pelvic pain. sts this morning was throbbing. sts some nausea. sts oliguria and frequency.

## 2014-08-01 NOTE — ED Notes (Signed)
PA at the bedside.

## 2014-08-02 LAB — GC/CHLAMYDIA PROBE AMP
CT Probe RNA: NEGATIVE
GC Probe RNA: NEGATIVE

## 2014-09-13 ENCOUNTER — Encounter: Payer: Self-pay | Admitting: *Deleted

## 2014-10-14 ENCOUNTER — Encounter: Payer: Medicaid Other | Admitting: Obstetrics & Gynecology

## 2015-03-21 DIAGNOSIS — D259 Leiomyoma of uterus, unspecified: Secondary | ICD-10-CM | POA: Insufficient documentation

## 2016-04-27 ENCOUNTER — Ambulatory Visit
Admission: RE | Admit: 2016-04-27 | Discharge: 2016-04-27 | Disposition: A | Discharge status: Home / Self Care | Payer: Medicaid Other | Source: Ambulatory Visit | Attending: Physician Assistant | Admitting: Physician Assistant

## 2016-04-27 ENCOUNTER — Other Ambulatory Visit: Payer: Self-pay | Admitting: Physician Assistant

## 2016-04-27 DIAGNOSIS — M79645 Pain in left finger(s): Secondary | ICD-10-CM

## 2016-12-27 ENCOUNTER — Encounter: Payer: Self-pay | Admitting: Allergy

## 2016-12-27 ENCOUNTER — Ambulatory Visit (INDEPENDENT_AMBULATORY_CARE_PROVIDER_SITE_OTHER): Payer: Medicaid Other | Admitting: Allergy

## 2016-12-27 VITALS — BP 132/72 | HR 99 | Temp 98.4°F | Resp 18 | Ht 59.0 in | Wt 135.2 lb

## 2016-12-27 DIAGNOSIS — J3089 Other allergic rhinitis: Secondary | ICD-10-CM | POA: Diagnosis not present

## 2016-12-27 DIAGNOSIS — J4520 Mild intermittent asthma, uncomplicated: Secondary | ICD-10-CM

## 2016-12-27 DIAGNOSIS — T781XXD Other adverse food reactions, not elsewhere classified, subsequent encounter: Secondary | ICD-10-CM

## 2016-12-27 DIAGNOSIS — L508 Other urticaria: Secondary | ICD-10-CM

## 2016-12-27 LAB — CBC WITH DIFFERENTIAL/PLATELET
Basophils Absolute: 0 cells/uL (ref 0–200)
Basophils Relative: 0 %
Eosinophils Absolute: 427 cells/uL (ref 15–500)
Eosinophils Relative: 7 %
HCT: 31.8 % — ABNORMAL LOW (ref 35.0–45.0)
HEMOGLOBIN: 10.6 g/dL — AB (ref 11.7–15.5)
LYMPHS ABS: 1464 {cells}/uL (ref 850–3900)
Lymphocytes Relative: 24 %
MCH: 28 pg (ref 27.0–33.0)
MCHC: 33.3 g/dL (ref 32.0–36.0)
MCV: 84.1 fL (ref 80.0–100.0)
MPV: 9.8 fL (ref 7.5–12.5)
Monocytes Absolute: 366 cells/uL (ref 200–950)
Monocytes Relative: 6 %
NEUTROS PCT: 63 %
Neutro Abs: 3843 cells/uL (ref 1500–7800)
PLATELETS: 261 10*3/uL (ref 140–400)
RBC: 3.78 MIL/uL — ABNORMAL LOW (ref 3.80–5.10)
RDW: 13.7 % (ref 11.0–15.0)
WBC: 6.1 10*3/uL (ref 3.8–10.8)

## 2016-12-27 MED ORDER — CETIRIZINE HCL 10 MG PO TABS: 10.0000 mg | ORAL_TABLET | Freq: Two times a day (BID) | ORAL | 5 refills | Status: DC

## 2016-12-27 MED ORDER — HYDROXYZINE HCL 10 MG PO TABS
ORAL_TABLET | ORAL | 2 refills | Status: DC
Start: 2016-12-27 — End: 2017-08-04

## 2016-12-27 MED ORDER — RANITIDINE HCL 150 MG PO TABS: 150.0000 mg | ORAL_TABLET | Freq: Two times a day (BID) | ORAL | 5 refills | Status: DC

## 2016-12-27 MED ORDER — MONTELUKAST SODIUM 10 MG PO TABS: 10.0000 mg | ORAL_TABLET | Freq: Every day | ORAL | 5 refills | Status: DC

## 2016-12-27 NOTE — Progress Notes (Signed)
New Patient Note  RE: [name] MRN: [id number] DOB: [birth year] Date of Office Visit: 12/27/2016  Referring provider: Selinda Orion Primary care provider: Shelda Jakes, FNP  Chief Complaint: rash  History of present illness: [name] is a 46 y.o. female presenting today for consultation for hives.  She states she has been told she has chronic hives.  She started having a rash around December 2017.  Started around her hairline and spread to neck and arms around January.  She reports the rash develops "welts" and rash is itchy and sometimes painful.  The rash does leave dark marks.   She has had some hand swelling and does report some joint pain in her hands.  She is concerned she might have lupus that she has been reading online that her symptoms can be associated with autoimmune diseases.  She has been given prednisone now 4 courses and has been given 2 steroid injections since December.  The prednisone she reports has really been the only thing that has helped.  She is currently on zantac twice a day and zyrtec daily.  Hydroxyzine she takes at bedtime.  Denies preceding illness, no new medications, no new foods, stings or changes in lotions/detergents/soaps.     With her allergies she has runny nose, itcy watery eyes and sneezing.  Symptoms are year-round.  She takes Flonase and Singulair as well as the Zyrtec to control the symptoms.  For her asthma she has albuterol inhaler that she reports using less than twice a week.  She states her breathing is quite well-controlled. She does not have a controller inhaler medicine besides the Singulair.  She does report some SOB more lately when she is more active and running around with her son.    She avoids strawberries, bananas which she reports makes her throat itch and also reports some nasuea without emesis.  Peaches, plums, nectarines, kiwi makes her throat itch.   She is able to eat oranges and canned fruits without issue.     She reports she gets recurring canker sores in mouth.      Review of systems: Review of Systems  Constitutional: Negative for chills, fever and malaise/fatigue.  HENT: Positive for congestion. Negative for ear discharge, ear pain, nosebleeds, sinus pain, sore throat and tinnitus.   Eyes: Negative for discharge and redness.  Respiratory: Positive for shortness of breath. Negative for cough and wheezing.   Cardiovascular: Negative for chest pain.  Gastrointestinal: Negative for abdominal pain, heartburn, nausea and vomiting.  Musculoskeletal: Positive for joint pain. Negative for myalgias.  Skin: Positive for itching and rash.  Neurological: Negative for dizziness and headaches.    All other systems negative unless noted above in HPI  Past medical history: Past Medical History:  Diagnosis Date  . Arthritis    degenerative disc disease-work related injury  . Asthma    controlled-last used rescue inhaler 3 months ago  . Fibroids   . GERD (gastroesophageal reflux disease)    zantac/pepcid prn  . Headache(784.0)   . High cholesterol   . Hypertension   . Pre-eclampsia   . Pre-eclampsia   . Urticaria     Past surgical history: Past Surgical History:  Procedure Laterality Date  . CESAREAN SECTION  2011  . fibroidectomy    . SPINAL FUSION  2006   L4-5  . WISDOM TOOTH EXTRACTION      Family history:  Family History  Problem Relation Age of Onset  . Hypertension  Mother   . High Cholesterol Mother   . Pneumonia Father   . Liver disease Father   . Hypertension Other   . Stroke Other   . High Cholesterol Other   . Breast cancer Maternal Aunt   . Pancreatic cancer Maternal Uncle   . Allergic rhinitis Neg Hx   . Angioedema Neg Hx   . Asthma Neg Hx   . Eczema Neg Hx   . Immunodeficiency Neg Hx   . Urticaria Neg Hx     Social history: She lives in an apartment with carpeting with electric heating and central cooling. There are birds inside of the home. There is no  concern for water damage, mildew or roaches in the home. She is a Agricultural engineer. She denies a smoking history   Medication List: Allergies as of 12/27/2016      Reactions   Plum Pulp Itching, Swelling   Prunus Persica Itching, Swelling   Strawberry Extract Itching, Swelling   Aspirin Other (See Comments)   Fever; Pt can take ibuprofen without problems.   Fruit & Vegetable Daily [nutritional Supplements] Other (See Comments)   ALL FRUIT WITH SEEDS- THROAT ITCHING   Methocarbamol Nausea Only, Other (See Comments)   sleepy   Morphine And Related Itching   Percocet [oxycodone-acetaminophen] Itching, Nausea Only   Pt can take plain Tylenol and Tylenol with codeine.   Shrimp [shellfish Allergy] Itching   THROAT ITCHING   Simvastatin Other (See Comments)   Body aches   Methocarbamol    Morphine    Oxycodone-acetaminophen       Medication List       Accurate as of 12/27/16  7:23 PM. Always use your most recent med list.          acetaminophen-codeine 300-30 MG tablet Commonly known as:  TYLENOL #3 Take 1-2 tablets by mouth every 6 (six) hours as needed for pain.   CAMILA 0.35 MG tablet Generic drug:  norethindrone TAKE ONE TABLET BY MOUTH ONE TIME DAILY   cetirizine 10 MG tablet Commonly known as:  ZYRTEC Take by mouth.   cyclobenzaprine 10 MG tablet Commonly known as:  FLEXERIL Take 10 mg by mouth at bedtime as needed.   desonide 0.05 % cream Commonly known as:  DESOWEN Apply 1 application topically 2 (two) times daily as needed. For itching   diclofenac 75 MG EC tablet Commonly known as:  VOLTAREN TAKE 1 TABLET BY MOUTH TWICE A DAY AFTER MEALS FOR INFLAMMATION/PAIN/SWELLING AS NEEDED   fluticasone 50 MCG/ACT nasal spray Commonly known as:  FLONASE Place 1 spray into the nose daily as needed. For allergies   hydrochlorothiazide 25 MG tablet Commonly known as:  HYDRODIURIL Take 25 mg by mouth daily.   HYDROcodone-acetaminophen 5-325 MG tablet Commonly known as:   NORCO Take 1 tablet by mouth every 6 (six) hours as needed for severe pain.   hydrocortisone 2.5 % lotion APPLY ON THE SKIN TWICE DAILY AS DIRECTED   hydrOXYzine 10 MG tablet Commonly known as:  ATARAX/VISTARIL TAKE 1 TABLET BY MOUTH AT BEDTIME AS NEEDED FOR SEVERE ITCHING   labetalol 200 MG tablet Commonly known as:  NORMODYNE Take 200 mg by mouth 2 (two) times daily.   lidocaine 2 % solution Commonly known as:  XYLOCAINE SWISH AND SPIT WITH 10 ML'S EVERY 4 HOURS AS NEEDED FOR THROAT PAIN   lisinopril-hydrochlorothiazide 10-12.5 MG tablet Commonly known as:  PRINZIDE,ZESTORETIC Take 1 tablet by mouth daily.   montelukast 10 MG tablet Commonly known  as:  SINGULAIR Take 10 mg by mouth at bedtime.   mupirocin ointment 2 % Commonly known as:  BACTROBAN Apply 1 application topically as needed (for wound).   naproxen 500 MG tablet Commonly known as:  NAPROSYN Take 1 tablet (500 mg total) by mouth 2 (two) times daily as needed for mild pain, moderate pain or headache (TAKE WITH MEALS.).   nystatin-triamcinolone cream Commonly known as:  MYCOLOG II Apply to affected area (behind both ears) TID x 10 days   ondansetron 4 MG disintegrating tablet Commonly known as:  ZOFRAN-ODT ondansetron 4 mg disintegrating tablet  TAKE ONE TABLET (4 MG TOTAL) BY MOUTH EVERY 8 (EIGHT) HOURS AS NEEDED FOR NAUSEA.   pantoprazole 40 MG tablet Commonly known as:  PROTONIX Take 40 mg by mouth at bedtime.   PATADAY 0.2 % Soln Generic drug:  Olopatadine HCl Apply 1 drop to eye daily as needed (FOR ALLERGIES).   Potassium Chloride ER 20 MEQ Tbcr Take 1 tablet by mouth 2 (two) times daily.   PROAIR HFA 108 (90 Base) MCG/ACT inhaler Generic drug:  albuterol ProAir HFA 90 mcg/actuation aerosol inhaler  INHALE ONE PUFF INTO THE LUNGS EVERY 6 (SIX) HOURS AS NEEDED FOR WHEEZING.   ranitidine 150 MG tablet Commonly known as:  ZANTAC Take by mouth.   SRONYX 0.1-20 MG-MCG tablet Generic drug:   levonorgestrel-ethinyl estradiol Sronyx 0.1 mg-20 mcg tablet  TAKE 1 TABLET BY MOUTH DAILY   traMADol 50 MG tablet Commonly known as:  ULTRAM Take 1 tablet (50 mg total) by mouth every 6 (six) hours as needed.   triamcinolone 0.025 % ointment Commonly known as:  KENALOG Apply topically.       Known medication allergies: Allergies  Allergen Reactions  . Plum Pulp Itching and Swelling  . Prunus Persica Itching and Swelling  . Strawberry Extract Itching and Swelling  . Aspirin Other (See Comments)    Fever; Pt can take ibuprofen without problems.  . Fruit & Vegetable Daily [Nutritional Supplements] Other (See Comments)    ALL FRUIT WITH SEEDS- THROAT ITCHING  . Methocarbamol Nausea Only and Other (See Comments)    sleepy  . Morphine And Related Itching  . Percocet [Oxycodone-Acetaminophen] Itching and Nausea Only    Pt can take plain Tylenol and Tylenol with codeine.  . Shrimp [Shellfish Allergy] Itching    THROAT ITCHING  . Simvastatin Other (See Comments)    Body aches  . Methocarbamol   . Morphine   . Oxycodone-Acetaminophen      Physical examination: Blood pressure 132/72, pulse 99, temperature 98.4 F (36.9 C), temperature source Oral, resp. rate 18, height 4\' 11"  (1.499 m), weight 135 lb 3.2 oz (61.3 kg), SpO2 97 %.  General: Alert, interactive, in no acute distress. HEENT: TMs pearly Defrancesco, turbinates mildly edematous without discharge, post-pharynx non erythematous. Neck: Supple without lymphadenopathy. Lungs: Clear to auscultation without wheezing, rhonchi or rales. {no increased work of breathing. CV: Normal S1, S2 without murmurs. Abdomen: Nondistended, nontender. Skin: Scattered erythematous urticarial type lesions primarily located Arms and neck , nonvesicular. Skin is a visibly dry. She also has hyperpigmented patches along the nape of her neck and several hyperpigmented patches and plaques located on arms Extremities:  No clubbing, cyanosis or  edema. Neuro:   Grossly intact.  Diagnositics/Labs:  Spirometry: FEV1: 2.36L  119%, FVC: 2.94L  127%  , ratio consistent with Nonobstructive pattern  Allergy testing: Deferred due to ongoing urticaria   Assessment and plan:   Chronic urticaria with  angioedema    - at this time unclear cause of her symptoms however she does have concerning features including joint pain as well as hyperpigmentation left after hives have resolved.  It is also likely that she has an overlapping component of eczema as her skin is very xerotic today in the hyperpigmented areas appears quite similar to previous eczematous lesions.      - We'll obtain the following labs to determine if we can identify any causes of your rash:   CBC with differential, CMP, inflammatory markers, chronic hive panel, ANA, tryptase, environmental allergen profile     - Take Zyrtec 10 mg twice a day along with your Zantac 150 mg twice a day and continue Singulair daily   - Continue use of hydrocortisone plus Eucerin compound to help with itch daily   - Continue hydroxyzine 25 mg at bedtime as needed for itch control   - If your labs show autoimmune signal we'll refer you to see rheumatology   Allergic rhinitis  - We'll obtain environmental allergen profile as above  - Continue your Zyrtec and Singulair as above  - Continue Flonase 2 sprays each nostril daily  Asthma mild intermittent  - Continue use of as needed albuterol 2 puffs every 4-6 hours as needed for cough, wheeze, shortness of breath or chest tightness. Use 15-20 minutes prior to activity.  Monitor frequency of use  - Singulair as above  Pollen food allergy syndrome   - Continue avoidance of fresh pitted fruits as well as strawberries and bananas and kiwis    - most are able to tolerate these foods in cooked forms  Follow-up in 3-4 months   I appreciate the opportunity to take part in [name]'s care. Please do not hesitate to contact me with  questions.  Sincerely,   Prudy Feeler, MD Allergy/Immunology [facility]

## 2016-12-27 NOTE — Patient Instructions (Addendum)
Hives    - at this time unclear cause of your hives and rash.    - We'll obtain the following labs to determine if we can identify any causes of your rash:   CBC with differential, CMP, inflammatory markers, chronic hive panel, ANA, tryptase, environmental allergen profile     - Take Zyrtec 10 mg twice a day along with your Zantac 150 mg twice a day and continue Singulair daily   - Continue use of hydrocortisone plus Eucerin compound to help with itch daily   - If your labs show autoimmune signal we'll refer you to see rheumatology   Allergies  - We'll obtain environmental allergen profile as above  - Continue your Zyrtec and Singulair as above  - Continue Flonase 2 sprays each nostril daily  Asthma  - Continue use of as needed albuterol 2 puffs every 4-6 hours as needed for cough, wheeze, shortness of breath or chest tightness. Monitor frequency of use  - Singulair as above  Pollen food allergy syndrome   - Continue avoidance of fresh pitted fruits as well as strawberries and bananas and kiwis    - most are able to tolerate these foods in cooked forms

## 2016-12-28 LAB — CP584 ZONE 3
ALLERGEN, CEDAR TREE, T6: 2.58 kU/L — AB
ALLERGEN, COMM SILVER BIRCH, T3: 88.3 kU/L — AB
ALLERGEN, D PTERNOYSSINUS, D1: 52 kU/L — AB
ALLERGEN, OAK, T7: 59.7 kU/L — AB
ASPERGILLUS FUMIGATUS M3: 0.19 kU/L — AB
Allergen, A. alternata, m6: <0.1 kU/L
Allergen, Black Locust, Acacia9: 0.17 kU/L — ABNORMAL HIGH
Allergen, Mucor Racemosus, M4: <0.1 kU/L
Allergen, Mulberry, t76: 0.2 kU/L — ABNORMAL HIGH
Allergen, S. Botryosum, m10: <0.1 kU/L
BAHIA GRASS: 5.32 kU/L — AB
Bermuda Grass: 2.91 kU/L — ABNORMAL HIGH
Box Elder IgE: 1.28 kU/L — ABNORMAL HIGH
CAT DANDER: 7.85 kU/L — AB
Cockroach: 0.17 kU/L — ABNORMAL HIGH
Common Ragweed: 4.56 kU/L — ABNORMAL HIGH
D. farinae: 52.1 kU/L — ABNORMAL HIGH
DOG DANDER: 2.3 kU/L — AB
Elm IgE: 0.74 kU/L — ABNORMAL HIGH
JOHNSON GRASS: 3.73 kU/L — AB
Meadow Grass: 11.3 kU/L — ABNORMAL HIGH
NETTLE: 0.19 kU/L — AB
PECAN/HICKORY TREE IGE: 13.7 kU/L — AB
PLANTAIN: 0.25 kU/L — AB
ROUGH PIGWEED IGE: 0.96 kU/L — AB

## 2016-12-28 LAB — COMPREHENSIVE METABOLIC PANEL
ALK PHOS: 78 U/L (ref 33–115)
ALT: 94 U/L — ABNORMAL HIGH (ref 6–29)
AST: 59 U/L — ABNORMAL HIGH (ref 10–35)
Albumin: 3.8 g/dL (ref 3.6–5.1)
BUN: 8 mg/dL (ref 7–25)
CO2: 24 mmol/L (ref 20–31)
Calcium: 9 mg/dL (ref 8.6–10.2)
Chloride: 105 mmol/L (ref 98–110)
Creat: 0.86 mg/dL (ref 0.50–1.10)
Glucose, Bld: 89 mg/dL (ref 65–99)
POTASSIUM: 4 mmol/L (ref 3.5–5.3)
Sodium: 138 mmol/L (ref 135–146)
TOTAL PROTEIN: 6.2 g/dL (ref 6.1–8.1)
Total Bilirubin: 0.4 mg/dL (ref 0.2–1.2)

## 2016-12-28 LAB — SEDIMENTATION RATE: SED RATE: 10 mm/h (ref 0–20)

## 2016-12-28 LAB — C-REACTIVE PROTEIN: CRP: 0.7 mg/L (ref <8.0)

## 2016-12-28 LAB — TRYPTASE: Tryptase: 3.5 ug/L (ref <11)

## 2016-12-28 LAB — ANA: ANA: NEGATIVE

## 2017-01-02 LAB — CP CHRONIC URTICARIA INDEX PANEL
TSH: 1.3 m[IU]/L
Thyroglobulin Ab: <1 IU/mL (ref <2)
Thyroperoxidase Ab SerPl-aCnc: 1 IU/mL (ref <9)

## 2017-05-01 ENCOUNTER — Ambulatory Visit: Payer: Medicaid Other | Admitting: Allergy

## 2017-06-13 ENCOUNTER — Ambulatory Visit: Payer: Medicaid Other | Admitting: Allergy

## 2017-06-14 ENCOUNTER — Telehealth: Payer: Self-pay

## 2017-06-14 NOTE — Telephone Encounter (Signed)
Sent notes to scheduling 

## 2017-06-27 ENCOUNTER — Ambulatory Visit: Payer: Medicaid Other | Admitting: Allergy

## 2017-07-16 NOTE — Progress Notes (Signed)
Cardiology Office Note   Date:  07/18/2017   ID:  [name], DOB [birth year], MRN [id number]  PCP:  Shelda Jakes, FNP  Cardiologist:   Jenkins Rouge, MD   No chief complaint on file.     History of Present Illness: [name] is a 46 y.o. female who presents for consultation regarding chest pain. Referred by Shelda Jakes FNP.  She has HTN. Chronic urticaria with angioedema. And asthma CRF HTN.  Seen by Dr Joie Bimler cardiology June 15/18. SSCP ache/sharp pain to right of sternum radiates to shoulders and back. Can last days and is positional. Had normal ECG LDL 134 Pain reproducible to palpation Stress echo was ordered but not done She had concerns about putting stress on her heart  She has a 62 yo son [name] who was premature and seems to have development issues Mom helps out and she tries to home school. She was severely pre eclamptic and he  Was delivered at 2 weeks spent 2 months at [facility]     Past Medical History:  Diagnosis Date  . Arthritis    degenerative disc disease-work related injury  . Asthma    controlled-last used rescue inhaler 3 months ago  . Fibroids   . GERD (gastroesophageal reflux disease)    zantac/pepcid prn  . Headache(784.0)   . High cholesterol   . Hypertension   . Pre-eclampsia   . Pre-eclampsia   . Urticaria     Past Surgical History:  Procedure Laterality Date  . CESAREAN SECTION  2011  . fibroidectomy    . SPINAL FUSION  2006   L4-5  . WISDOM TOOTH EXTRACTION       Current Outpatient Medications  Medication Sig Dispense Refill  . acetaminophen-codeine (TYLENOL #3) 300-30 MG per tablet Take 1-2 tablets by mouth every 6 (six) hours as needed for pain. 30 tablet 1  . albuterol (PROAIR HFA) 108 (90 Base) MCG/ACT inhaler ProAir HFA 90 mcg/actuation aerosol inhaler  INHALE ONE PUFF INTO THE LUNGS EVERY 6 (SIX) HOURS AS NEEDED FOR WHEEZING.    Marland Kitchen CAMILA 0.35 MG tablet TAKE ONE TABLET BY MOUTH ONE TIME DAILY 1 each PRN  .  cetirizine (ZYRTEC) 10 MG tablet Take 1 tablet (10 mg total) by mouth 2 (two) times daily. 60 tablet 5  . cyclobenzaprine (FLEXERIL) 10 MG tablet Take 10 mg by mouth at bedtime as needed.  0  . desonide (DESOWEN) 0.05 % cream Apply 1 application topically 2 (two) times daily as needed. For itching    . diclofenac (VOLTAREN) 75 MG EC tablet TAKE 1 TABLET BY MOUTH TWICE A DAY AFTER MEALS FOR INFLAMMATION/PAIN/SWELLING AS NEEDED  1  . fluticasone (FLONASE) 50 MCG/ACT nasal spray Place 1 spray into the nose daily as needed. For allergies    . gabapentin (NEURONTIN) 100 MG capsule Take 1 capsule by mouth 2 (two) times daily.    . hydrochlorothiazide (HYDRODIURIL) 25 MG tablet Take 25 mg by mouth daily.     Marland Kitchen HYDROcodone-acetaminophen (NORCO) 5-325 MG per tablet Take 1 tablet by mouth every 6 (six) hours as needed for severe pain. 6 tablet 0  . hydrocortisone 2.5 % lotion APPLY ON THE SKIN TWICE DAILY AS DIRECTED  0  . hydrOXYzine (ATARAX/VISTARIL) 10 MG tablet Take 20mg  (2 tabs) at bedtime as needed for itch 60 tablet 2  . labetalol (NORMODYNE) 200 MG tablet Take 200 mg by mouth 2 (two) times daily.    Marland Kitchen levonorgestrel-ethinyl estradiol ([place])  0.1-20 MG-MCG tablet Sronyx 0.1 mg-20 mcg tablet  TAKE 1 TABLET BY MOUTH DAILY    . lidocaine (XYLOCAINE) 2 % solution SWISH AND SPIT WITH 10 ML'S EVERY 4 HOURS AS NEEDED FOR THROAT PAIN  0  . lisinopril-hydrochlorothiazide (PRINZIDE,ZESTORETIC) 10-12.5 MG tablet Take 1 tablet by mouth daily.  0  . montelukast (SINGULAIR) 10 MG tablet Take 1 tablet (10 mg total) by mouth at bedtime. 30 tablet 5  . mupirocin ointment (BACTROBAN) 2 % Apply 1 application topically as needed (for wound).     . naproxen (NAPROSYN) 500 MG tablet Take 1 tablet (500 mg total) by mouth 2 (two) times daily as needed for mild pain, moderate pain or headache (TAKE WITH MEALS.). 20 tablet 0  . nystatin-triamcinolone (MYCOLOG II) cream Apply to affected area (behind both ears) TID x 10 days  15 g 0  . Olopatadine HCl (PATADAY) 0.2 % SOLN Apply 1 drop to eye daily as needed (FOR ALLERGIES).    Marland Kitchen ondansetron (ZOFRAN-ODT) 4 MG disintegrating tablet ondansetron 4 mg disintegrating tablet  TAKE ONE TABLET (4 MG TOTAL) BY MOUTH EVERY 8 (EIGHT) HOURS AS NEEDED FOR NAUSEA.    . pantoprazole (PROTONIX) 40 MG tablet Take 40 mg by mouth at bedtime.    . Potassium Chloride ER 20 MEQ TBCR Take 1 tablet by mouth 2 (two) times daily.  0  . ranitidine (ZANTAC) 150 MG tablet Take 1 tablet (150 mg total) by mouth 2 (two) times daily. 60 tablet 5  . traMADol (ULTRAM) 50 MG tablet Take 1 tablet (50 mg total) by mouth every 6 (six) hours as needed. 15 tablet 0  . triamcinolone (KENALOG) 0.025 % ointment Apply topically.    Marland Kitchen ZETIA 10 MG tablet Take 10 mg by mouth daily.  1   No current facility-administered medications for this visit.     Allergies:   Plum pulp; Prunus persica; Strawberry extract; Aspirin; Fruit & vegetable daily [nutritional supplements]; Methocarbamol; Morphine and related; Percocet [oxycodone-acetaminophen]; Shrimp [shellfish allergy]; Simvastatin; Methocarbamol; Morphine; Oxycodone-acetaminophen; and Saccharin    Social History:  The patient  reports that  has never smoked. she has never used smokeless tobacco. She reports that she does not drink alcohol or use drugs.   Family History:  The patient's family history includes Breast cancer in her maternal aunt; High Cholesterol in her mother and other; Hypertension in her mother and other; Liver disease in her father; Pancreatic cancer in her maternal uncle; Pneumonia in her father; Stroke in her other.    ROS:  Please see the history of present illness.   Otherwise, review of systems are positive for none.   All other systems are reviewed and negative.    PHYSICAL EXAM: VS:  BP 128/82   Pulse 66   Ht 5\' 3"  (1.6 m)   Wt 137 lb 12 oz (62.5 kg)   SpO2 99%   BMI 24.40 kg/m  , BMI Body mass index is 24.4 kg/m. Affect  appropriate Healthy:  appears stated age 76: normal Neck supple with no adenopathy JVP normal no bruits no thyromegaly Lungs clear with no wheezing and good diaphragmatic motion Heart:  S1/S2 no murmur, no rub, gallop or click PMI normal Abdomen: benighn, BS positve, no tenderness, no AAA no bruit.  No HSM or HJR Distal pulses intact with no bruits No edema Neuro non-focal Skin warm and dry No muscular weakness    EKG:  2013 SR rate 58 normal  07/18/17 SR rate 66 normal  Recent Labs: 12/27/2016: ALT 94; BUN 8; Creat 0.86; Hemoglobin 10.6; Platelets 261; Potassium 4.0; Sodium 138; TSH 1.30    Lipid Panel No results found for: CHOL, TRIG, HDL, CHOLHDL, VLDL, LDLCALC, LDLDIRECT    Wt Readings from Last 3 Encounters:  07/18/17 137 lb 12 oz (62.5 kg)  12/27/16 135 lb 3.2 oz (61.3 kg)  08/04/12 123 lb (55.8 kg)      Other studies Reviewed: Additional studies/ records that were reviewed today include: Notes from primary, allergist labs and ECG 2013 .    ASSESSMENT AND PLAN:  1. Chest Pain atypical f/u stress echo is appropriate  2. Asthma no active wheezing PRN inhaler  3. Urticaria/AngioEdema avoid ACE/ARB f/u allergy  4. HTN Well controlled.  Continue current medications and low sodium Dash type diet.     Current medicines are reviewed at length with the patient today.  The patient does not have concerns regarding medicines.  The following changes have been made:  no change  Labs/ tests ordered today include: Stress Echo   Orders Placed This Encounter  Procedures  . EKG 12-Lead  . ECHOCARDIOGRAM STRESS TEST     Disposition:   FU with cardiology PRN      Signed, Jenkins Rouge, MD  07/18/2017 5:03 PM    [facility] [address], [place], [place]  [id number] Phone: [phone]; Fax: [phone]

## 2017-07-18 ENCOUNTER — Encounter: Payer: Self-pay | Admitting: Cardiovascular Disease

## 2017-07-18 ENCOUNTER — Ambulatory Visit (INDEPENDENT_AMBULATORY_CARE_PROVIDER_SITE_OTHER): Payer: Medicaid Other | Admitting: Cardiovascular Disease

## 2017-07-18 VITALS — BP 128/82 | HR 66 | Ht 63.0 in | Wt 137.8 lb

## 2017-07-18 DIAGNOSIS — R0789 Other chest pain: Secondary | ICD-10-CM

## 2017-07-18 DIAGNOSIS — I10 Essential (primary) hypertension: Secondary | ICD-10-CM

## 2017-07-18 NOTE — Patient Instructions (Addendum)
Medication Instructions:  Your physician recommends that you continue on your current medications as directed. Please refer to the Current Medication list given to you today.  Labwork: NONE  Testing/Procedures: Your physician has requested that you have a stress echocardiogram. For further information please visit [email or web address]. Please follow instruction sheet as given.  Follow-Up: Your physician wants you to follow-up as needed with Dr. Johnsie Cancel.    If you need a refill on your cardiac medications before your next appointment, please call your pharmacy.

## 2017-07-25 ENCOUNTER — Ambulatory Visit (INDEPENDENT_AMBULATORY_CARE_PROVIDER_SITE_OTHER): Payer: Medicaid Other | Admitting: Allergy

## 2017-07-25 ENCOUNTER — Encounter: Payer: Self-pay | Admitting: Allergy

## 2017-07-25 VITALS — BP 128/68 | HR 104 | Resp 20

## 2017-07-25 DIAGNOSIS — L2084 Intrinsic (allergic) eczema: Secondary | ICD-10-CM

## 2017-07-25 DIAGNOSIS — L508 Other urticaria: Secondary | ICD-10-CM

## 2017-07-25 DIAGNOSIS — T781XXD Other adverse food reactions, not elsewhere classified, subsequent encounter: Secondary | ICD-10-CM

## 2017-07-25 DIAGNOSIS — J3089 Other allergic rhinitis: Secondary | ICD-10-CM | POA: Diagnosis not present

## 2017-07-25 DIAGNOSIS — J4520 Mild intermittent asthma, uncomplicated: Secondary | ICD-10-CM | POA: Diagnosis not present

## 2017-07-25 MED ORDER — ALBUTEROL SULFATE HFA 108 (90 BASE) MCG/ACT IN AERS: INHALATION_SPRAY | RESPIRATORY_TRACT | 1 refills | Status: DC

## 2017-07-25 MED ORDER — HYDROCORTISONE 2.5 % EX LOTN: TOPICAL_LOTION | CUTANEOUS | 5 refills | Status: DC

## 2017-07-25 MED ORDER — ELIDEL 1 % EX CREA: TOPICAL_CREAM | Freq: Two times a day (BID) | CUTANEOUS | 5 refills | Status: DC

## 2017-07-25 NOTE — Progress Notes (Signed)
Follow-up Note  RE: [name] MRN: [id number] DOB: June 14, 1971 Date of Office Visit: 07/25/2017   History of present illness: [name] is a 46 y.o. female presenting today for follow-up of hives, allergic rhinitis, asthma and pollen food allergy syndrome.  She was last seen in the office on 12/27/16 by myself for initial evaluation for urticaria with angioedema.  She also has allergic rhinitis, mild intermittent asthma and pollen food allergy syndrome.  Since her last visit she states she has continued to struggle with her skin both with hives and diagnosed by dermatology with adult-onset eczema.     For her hives she continues to take zyrtec twice a day, zantac twice a day and singulair daily.  She also takes hydroxyzine at bedtime as needed which does help with nighttime itch.     With her eczema she saw dermatology since her last visit where she had a skin biopsy of affected skin which was consistent with subacute spongiotic dermatitis and based on this a diagnoses of atopic dermatitis was made.  She was prescribed clobetasol for body and triamcinolone for face and neck.  She does not feel that either of these have been that effective for her.  She has also tried [place] that also did not help.   She states the only thing thus far that has helped especially with itch is her Eucerin/hydrocortisone compound.  She has never tried Elidel.  Her dermatologist discussed option of dupixent with her given biopsy results.  She is not sure when she is scheduled to follow-up with dermatology.     With her allergic rhinitis she denies any significant nasal or ocular symptoms currently.  She does use antihistamines as above and flonase as needed.    With her asthma she reports she has had some SOB and cough with activity but has never tried albuterol prior to activity.  Otherwise she denies any symptoms at other times and no nighttime awakenings.  She has had no ED/UC visits for asthma since last visit  and no oral steroids for asthma control.  She continues to avoid pitted fruits as well as strawberry, bananas and kiwis.  She has access to epipen.  No accidental ingestions.     She did see a rheumatologist since last visit as she does endorse joint pain. She had a more thorough rheumatology evaluation that shows a positive anti-smooth ab with negative Hep A, B, C; ANA, SSA/SSB, RA/AntiCCP, Mitochon.  She as prescribe gabapentin for myalgia.   She also has seen GI for elevated LFTs with positive anti-smooth ab.  It was recommended she has an abd [place].  She was prescribed to take zofran as needed and protonix daily.    Review of systems: Review of Systems  Constitutional: Negative for chills, fever and malaise/fatigue.  HENT: Negative for congestion, ear discharge, ear pain, nosebleeds, sinus pain, sore throat and tinnitus.   Eyes: Negative for pain, discharge and redness.  Respiratory: Positive for cough and shortness of breath. Negative for sputum production and wheezing.   Cardiovascular: Negative for chest pain.  Gastrointestinal: Positive for abdominal pain. Negative for constipation, diarrhea, heartburn, nausea and vomiting.  Musculoskeletal: Positive for joint pain and myalgias.  Skin: Positive for itching and rash.  Neurological: Negative for dizziness and headaches.    All other systems negative unless noted above in HPI  Past medical/social/surgical/family history have been reviewed and are unchanged unless specifically indicated below.  No changes  Medication List: Allergies as of 07/25/2017  Reactions   Plum Pulp Itching, Swelling   Prunus Persica Itching, Swelling   Strawberry Extract Itching, Swelling   Aspirin Other (See Comments)   Fever; Pt can take ibuprofen without problems.   Fruit & Vegetable Daily [nutritional Supplements] Other (See Comments)   ALL FRUIT WITH SEEDS- THROAT ITCHING   Methocarbamol Nausea Only, Other (See Comments)   sleepy   Morphine And  Related Itching   Percocet [oxycodone-acetaminophen] Itching, Nausea Only   Pt can take plain Tylenol and Tylenol with codeine.   Shrimp [shellfish Allergy] Itching   THROAT ITCHING   Simvastatin Other (See Comments)   Body aches   Methocarbamol    Morphine    Oxycodone-acetaminophen    Saccharin Nausea Only      Medication List        Accurate as of 07/25/17  5:57 PM. Always use your most recent med list.          acetaminophen-codeine 300-30 MG tablet Commonly known as:  TYLENOL #3 Take 1-2 tablets by mouth every 6 (six) hours as needed for pain.   albuterol 108 (90 Base) MCG/ACT inhaler Commonly known as:  PROAIR HFA 2 puffs by mouth every 4-6 hours as needed.   cetirizine 10 MG tablet Commonly known as:  ZYRTEC Take 1 tablet (10 mg total) by mouth 2 (two) times daily.   cyclobenzaprine 10 MG tablet Commonly known as:  FLEXERIL Take 10 mg by mouth at bedtime as needed.   diclofenac 75 MG EC tablet Commonly known as:  VOLTAREN TAKE 1 TABLET BY MOUTH TWICE A DAY AFTER MEALS FOR INFLAMMATION/PAIN/SWELLING AS NEEDED   ELIDEL 1 % cream Generic drug:  pimecrolimus Apply topically 2 (two) times daily.   fluticasone 50 MCG/ACT nasal spray Commonly known as:  FLONASE Place 1 spray into the nose daily as needed. For allergies   gabapentin 100 MG capsule Commonly known as:  NEURONTIN Take 1 capsule by mouth 2 (two) times daily.   HYDROcodone-acetaminophen 5-325 MG tablet Commonly known as:  NORCO Take 1 tablet by mouth every 6 (six) hours as needed for severe pain.   hydrocortisone 2.5 % lotion Mix 1:1 ration with Eucerin. Apply 2 times daily.   hydrOXYzine 10 MG tablet Commonly known as:  ATARAX/VISTARIL Take 20mg  (2 tabs) at bedtime as needed for itch   labetalol 200 MG tablet Commonly known as:  NORMODYNE Take 200 mg by mouth 2 (two) times daily.   lidocaine 2 % solution Commonly known as:  XYLOCAINE SWISH AND SPIT WITH 10 ML'S EVERY 4 HOURS AS NEEDED  FOR THROAT PAIN   lisinopril-hydrochlorothiazide 10-12.5 MG tablet Commonly known as:  PRINZIDE,ZESTORETIC Take 1 tablet by mouth daily.   montelukast 10 MG tablet Commonly known as:  SINGULAIR Take 1 tablet (10 mg total) by mouth at bedtime.   mupirocin ointment 2 % Commonly known as:  BACTROBAN Apply 1 application topically as needed (for wound).   naproxen 500 MG tablet Commonly known as:  NAPROSYN Take 1 tablet (500 mg total) by mouth 2 (two) times daily as needed for mild pain, moderate pain or headache (TAKE WITH MEALS.).   nystatin-triamcinolone cream Commonly known as:  MYCOLOG II Apply to affected area (behind both ears) TID x 10 days   ondansetron 4 MG disintegrating tablet Commonly known as:  ZOFRAN-ODT ondansetron 4 mg disintegrating tablet  TAKE ONE TABLET (4 MG TOTAL) BY MOUTH EVERY 8 (EIGHT) HOURS AS NEEDED FOR NAUSEA.   pantoprazole 40 MG tablet Commonly known as:  PROTONIX Take 40 mg by mouth at bedtime.   PATADAY 0.2 % Soln Generic drug:  Olopatadine HCl Apply 1 drop to eye daily as needed (FOR ALLERGIES).   Potassium Chloride ER 20 MEQ Tbcr Take 1 tablet by mouth 2 (two) times daily.   ranitidine 150 MG tablet Commonly known as:  ZANTAC Take 1 tablet (150 mg total) by mouth 2 (two) times daily.   traMADol 50 MG tablet Commonly known as:  ULTRAM Take 1 tablet (50 mg total) by mouth every 6 (six) hours as needed.   triamcinolone 0.025 % ointment Commonly known as:  KENALOG Apply topically.   ZETIA 10 MG tablet Generic drug:  ezetimibe Take 10 mg by mouth daily.       Known medication allergies: Allergies  Allergen Reactions  . Plum Pulp Itching and Swelling  . Prunus Persica Itching and Swelling  . Strawberry Extract Itching and Swelling  . Aspirin Other (See Comments)    Fever; Pt can take ibuprofen without problems.  . Fruit & Vegetable Daily [Nutritional Supplements] Other (See Comments)    ALL FRUIT WITH SEEDS- THROAT ITCHING  .  Methocarbamol Nausea Only and Other (See Comments)    sleepy  . Morphine And Related Itching  . Percocet [Oxycodone-Acetaminophen] Itching and Nausea Only    Pt can take plain Tylenol and Tylenol with codeine.  . Shrimp [Shellfish Allergy] Itching    THROAT ITCHING  . Simvastatin Other (See Comments)    Body aches  . Methocarbamol   . Morphine   . Oxycodone-Acetaminophen   . Saccharin Nausea Only     Physical examination: Blood pressure 128/68, pulse (!) 104, resp. rate 20.  General: Alert, interactive, in no acute distress. HEENT: PERRLA, TMs pearly [place], turbinates mildly edematous without discharge, post-pharynx non erythematous. Neck: Supple without lymphadenopathy. Lungs: Clear to auscultation without wheezing, rhonchi or rales. {no increased work of breathing. CV: Normal S1, S2 without murmurs. Abdomen: Nondistended, nontender. Skin: hyperpigmented patches/plaques at temples of forehead, b/l anterior thighs, back, hands, nape of neck. Extremities:  No clubbing, cyanosis or edema. Neuro:   Grossly intact.  Diagnositics/Labs: Labs:  Component     Latest Ref Rng & Units 12/27/2016  WBC     3.8 - 10.8 K/uL 6.1  RBC     3.80 - 5.10 MIL/uL 3.78 (L)  Hemoglobin     11.7 - 15.5 g/dL 10.6 (L)  HCT     35.0 - 45.0 % 31.8 (L)  MCV     80.0 - 100.0 fL 84.1  MCH     27.0 - 33.0 pg 28.0  MCHC     32.0 - 36.0 g/dL 33.3  RDW     11.0 - 15.0 % 13.7  Platelets     140 - 400 K/uL 261  MPV     7.5 - 12.5 fL 9.8  NEUT#     1,500 - 7,800 cells/uL 3,843  Lymphocyte #     850 - 3,900 cells/uL 1,464  Monocyte #     200 - 950 cells/uL 366  Eosinophils Absolute     15 - 500 cells/uL 427  Basophils Absolute     0 - 200 cells/uL 0  Neutrophils     % 63  Lymphocytes     % 24  Monocytes Relative     % 6  Eosinophil     % 7  Basophil     % 0   Component     Latest Ref Rng &  Units 12/27/2016  Sodium     135 - 146 mmol/L 138  Potassium     3.5 - 5.3 mmol/L 4.0    Chloride     98 - 110 mmol/L 105  CO2     20 - 31 mmol/L 24  Glucose     65 - 99 mg/dL 89  BUN     7 - 25 mg/dL 8  Creatinine     0.50 - 1.10 mg/dL 0.86  Total Bilirubin     0.2 - 1.2 mg/dL 0.4  Alkaline Phosphatase     33 - 115 U/L 78  AST     10 - 35 U/L 59 (H)  ALT     6 - 29 U/L 94 (H)  Total Protein     6.1 - 8.1 g/dL 6.2  Albumin     3.6 - 5.1 g/dL 3.8  Calcium     8.6 - 10.2 mg/dL 9.0   Component     Latest Ref Rng & Units 12/27/2016  TSH     mIU/L 1.30  Thyroperoxidase Ab SerPl-aCnc     <9 IU/mL 1  Thyroglobulin Ab     <2 IU/mL <1  Histamine Release     <16 % <16   Component     Latest Ref Rng & Units 12/27/2016  Tryptase     <11 ug/L 3.5   Component     Latest Ref Rng & Units 12/27/2016  Allergen, D pternoyssinus,d7     kU/L 52.00 (H)  D. farinae     kU/L 52.10 (H)  Cat Dander     kU/L 7.85 (H)  Dog Dander     kU/L 2.30 (H)  [place] Grass     kU/L 2.91 (H)  Meadow Grass     kU/L 11.30 (H)  [place] Grass     kU/L 3.73 (H)  [place] Grass     kU/L 5.32 (H)  Cockroach     kU/L 0.17 (H)  Allergen, P. notatum, m1     kU/L <0.10  Allergen, C. Herbarum, M2     kU/L <0.10  Aspergillus fumigatus, m3     kU/L 0.19 (H)  Allergen, Mucor Racemosus, M4     kU/L <0.10  Allergen, A. alternata, m6     kU/L <0.10  Allergen, S. Botryosum, m10     kU/L <0.10  Box Elder IgE     kU/L 1.28 (H)  Allergen, Comm Silver Wendee Copp, t9     kU/L 88.30 (H)  Allergen, Cedar tree, t12     kU/L 2.58 (H)  Allergen, Oak,t7     kU/L 59.70 (H)  Elm IgE     kU/L 0.74 (H)  Pecan/Hickory Tree IgE     kU/L 13.70 (H)  Allergen, Mulberry, t76     kU/L 0.20 (H)  Common Ragweed     kU/L 4.56 (H)  Plantain     kU/L 0.25 (H)  Rough Pigweed  IgE     kU/L 0.96 (H)  Nettle     kU/L 0.19 (H)  Allergen, Black Locust, Acacia9     kU/L 0.17 (H)   Component     Latest Ref Rng & Units 12/27/2016  Sed Rate     0 - 20 mm/hr 10  CRP     <8.0 mg/L 0.7  Anit Nuclear  Antibody(ANA)     NEGATIVE NEG   Spirometry: FEV1: 2.57L 129%, FVC: 3.01L 123%, ratio consistent with nonobstructive pattern  Assessment and plan:  Urticaria with angioedema, chronic    - no urticarial lesions on exam today    - continue Zyrtec 10 mg twice a day along with your Zantac 150 mg twice a day and continue Singulair daily   - Continue use of hydrocortisone plus Eucerin compound to help with itch daily as well as eczema  Atopic dermatitis  - will refill your hydrocortisone plus Eucerin compound  - will try Elidel for eczema control as you have not tried this agent before  - your dermatologist has discussed use of Dupixent which is an injectable biologic agent for eczema control.  I also agree that you will likely benefit from this medication.  We will start approval process.   She has not had much improvement with topical steroids or Eucrisa.   - take prednisone pack as prescribed   Allergic rhinitis  - continue avoidance measures for dust mites, cat, dog, grasses, cockroach, mold, trees, weeds  - Continue your Zyrtec and Singulair as above  - Continue Flonase 2 sprays each nostril daily  Asthma, mild intermittent  - Continue use of as needed albuterol 2 puffs every 4-6 hours as needed for cough, wheeze, shortness of breath or chest tightness. Monitor frequency of use.  Will refill your albuterol today  - Singulair as above  Asthma control goals:   Full participation in all desired activities (may need albuterol before activity)  Albuterol use two time or less a week on average (not counting use with activity)  Cough interfering with sleep two time or less a month  Oral steroids no more than once a year  No hospitalizations  Pollen food allergy syndrome   - Continue avoidance of fresh pitted fruits as well as strawberries and bananas and kiwis    - most are able to tolerate these foods in cooked forms   - she has access to Epipen  Follow-up 4-6 months or sooner  if needed   I appreciate the opportunity to take part in Alany's care. Please do not hesitate to contact me with questions.  Sincerely,   Prudy Feeler, MD Allergy/Immunology [facility]

## 2017-07-25 NOTE — Patient Instructions (Addendum)
Hives     - continue Zyrtec 10 mg twice a day along with your Zantac 150 mg twice a day and continue Singulair daily   - Continue use of hydrocortisone plus Eucerin compound to help with itch daily as well as eczema  Eczema  - will refill your hydrocortisone plus Eucerin compound  - will try Elidel for eczema control as you have not tried this agent before  - your dermatologist has discussed use of Dupixent which is an injectable biologic agent for eczema control.  I also agree that you will likely benefit from this medication.  We will start approval process.   - take prednisone pack as prescribed   Allergies  - continue avoidance measures for dust mites, cat, dog, grasses, cockroach, mold, trees, weeds  - Continue your Zyrtec and Singulair as above  - Continue Flonase 2 sprays each nostril daily  Asthma  - Continue use of as needed albuterol 2 puffs every 4-6 hours as needed for cough, wheeze, shortness of breath or chest tightness. Monitor frequency of use.  Will refill your albuterol today  - Singulair as above  Asthma control goals:   Full participation in all desired activities (may need albuterol before activity)  Albuterol use two time or less a week on average (not counting use with activity)  Cough interfering with sleep two time or less a month  Oral steroids no more than once a year  No hospitalizations   Pollen food allergy syndrome   - Continue avoidance of fresh pitted fruits as well as strawberries and bananas and kiwis    - most are able to tolerate these foods in cooked forms  Follow-up 4-6 months or sooner if needed

## 2017-07-31 ENCOUNTER — Telehealth: Payer: Self-pay | Admitting: Allergy

## 2017-07-31 NOTE — Telephone Encounter (Signed)
[facility] called stating that patient is requesting that her Elidel cream and Eucerin and hydrocortisone compound lotion be doubled saying it is not enough.. After speaking with Beth we denied the request until speaking with Dr. Nelva Bush. I called and asked the patient how much she is using and how often. Patient stated that she has an eczema flare up and is using her creams/lotions 3 to 5 times daily all over her body. She stated that it is everywhere but on her lower legs and feet. She stated she is wanting to get double the medication so that she has enough and so she doesn't have to keep paying the copay. I will speak with Dr. Nelva Bush tomorrow when she comes in.

## 2017-08-01 ENCOUNTER — Telehealth (HOSPITAL_COMMUNITY): Payer: Self-pay | Admitting: *Deleted

## 2017-08-01 ENCOUNTER — Other Ambulatory Visit: Payer: Self-pay

## 2017-08-01 DIAGNOSIS — L2084 Intrinsic (allergic) eczema: Secondary | ICD-10-CM

## 2017-08-01 MED ORDER — ELIDEL 1 % EX CREA: TOPICAL_CREAM | Freq: Two times a day (BID) | CUTANEOUS | 5 refills | Status: DC

## 2017-08-01 MED ORDER — HYDROCORTISONE 2.5 % EX LOTN: TOPICAL_LOTION | CUTANEOUS | 5 refills | Status: DC

## 2017-08-01 NOTE — Telephone Encounter (Signed)
Patient did not answer and mailbox was full so was unable to leave a message. Will try again later today.

## 2017-08-01 NOTE — Telephone Encounter (Signed)
Patient called and wants the rx to be increased by amount and it stated in the prescriptions instructions in which it states apply 2-3 times all over body.

## 2017-08-01 NOTE — Telephone Encounter (Signed)
Left message on voicemail per DPR in reference to upcoming appointment scheduled on 08/08/17 at 2:30 with detailed instructions given per Stress Test Requisition Sheet for the test. LM to arrive 30 minutes early, and that it is imperative to arrive on time for appointment to keep from having the test rescheduled. If you need to cancel or reschedule your appointment, please call the office within 24 hours of your appointment. Failure to do so may result in a cancellation of your appointment, and a $50 no show fee. Phone number given for call back for any questions. [name]

## 2017-08-01 NOTE — Telephone Encounter (Signed)
Talked with Dr. Nelva Bush about this matter and she stated that it was fine to send in double the medication. She also stated that she should only use it twice a day and only a thin layer. I have sent in the medication and have called the patient to inform her that the medication has been sent in.

## 2017-08-04 ENCOUNTER — Other Ambulatory Visit: Payer: Self-pay | Admitting: Allergy

## 2017-08-05 NOTE — Telephone Encounter (Signed)
RF for hydroxyzine 10 mg x 5 at [facility]

## 2017-08-07 ENCOUNTER — Other Ambulatory Visit: Payer: Self-pay

## 2017-08-07 ENCOUNTER — Telehealth: Payer: Self-pay | Admitting: Allergy

## 2017-08-07 DIAGNOSIS — L2084 Intrinsic (allergic) eczema: Secondary | ICD-10-CM

## 2017-08-07 NOTE — Telephone Encounter (Signed)
Patient called stating that her medication was still called in wrong. She stated that she is wanting at least 100 or 200 grams of the Elidel cream and two tubs of the compound which is like 450+ml each. Patient stated that she is having to use it all over and needs enough. She also stated that she is using a thin layer and no more than 3 times a day. Dr. Nelva Bush please advise.

## 2017-08-08 ENCOUNTER — Ambulatory Visit (HOSPITAL_COMMUNITY): Payer: Medicaid Other | Attending: Cardiovascular Disease

## 2017-08-08 ENCOUNTER — Other Ambulatory Visit: Payer: Self-pay

## 2017-08-08 ENCOUNTER — Ambulatory Visit (HOSPITAL_COMMUNITY): Payer: Medicaid Other

## 2017-08-08 DIAGNOSIS — R0789 Other chest pain: Secondary | ICD-10-CM | POA: Diagnosis not present

## 2017-08-08 DIAGNOSIS — I10 Essential (primary) hypertension: Secondary | ICD-10-CM | POA: Insufficient documentation

## 2017-08-08 MED ORDER — ELIDEL 1 % EX CREA: TOPICAL_CREAM | Freq: Two times a day (BID) | CUTANEOUS | 3 refills | Status: DC

## 2017-08-08 NOTE — Addendum Note (Signed)
Addended by: Lucrezia Starch I on: 08/08/2017 11:23 AM   Modules accepted: Orders

## 2017-08-08 NOTE — Telephone Encounter (Signed)
I have sent the Elidel to the pharmacy with 120 g. The compound cream was marked as not taking. Please advise and thank you.

## 2017-08-09 NOTE — Telephone Encounter (Signed)
Called patient but received no answer at either numbers listed.

## 2017-08-09 NOTE — Telephone Encounter (Signed)
The elidel should only be applied to eczema areas and not "all over" if skin is unaffected and should be used 1-2 times a day.    The compounded medication with lotion+topical steroid I am fine if she needs enough for body as this is acting moreso like a moisturizer.

## 2017-08-14 ENCOUNTER — Telehealth: Payer: Self-pay | Admitting: Cardiovascular Disease

## 2017-08-14 NOTE — Telephone Encounter (Signed)
Called patient with stress echo results. Per Dr. Johnsie Cancel, stress echo is normal.

## 2017-08-14 NOTE — Telephone Encounter (Signed)
New message     Pt returned call for echo results through answering service please call

## 2017-08-15 ENCOUNTER — Other Ambulatory Visit: Payer: Self-pay

## 2017-08-15 DIAGNOSIS — L2084 Intrinsic (allergic) eczema: Secondary | ICD-10-CM

## 2017-08-15 MED ORDER — HYDROCORTISONE 2.5 % EX LOTN: TOPICAL_LOTION | CUTANEOUS | 5 refills | Status: DC

## 2017-08-15 MED ORDER — ELIDEL 1 % EX CREA: TOPICAL_CREAM | Freq: Two times a day (BID) | CUTANEOUS | 3 refills | Status: DC

## 2017-08-15 NOTE — Telephone Encounter (Signed)
[name], [place] spoke with pharmacy and prescription has been sent in as instructed by pharmacist to get covered by insurance.

## 2017-08-16 ENCOUNTER — Telehealth: Payer: Self-pay | Admitting: *Deleted

## 2017-08-16 NOTE — Telephone Encounter (Signed)
Patient was denied Dupixent for atopic dermatitis. I will resubmit for approval to [place] [facility]

## 2017-08-26 ENCOUNTER — Encounter: Payer: Self-pay | Admitting: *Deleted

## 2017-09-10 ENCOUNTER — Telehealth: Payer: Self-pay | Admitting: *Deleted

## 2017-09-10 NOTE — Telephone Encounter (Signed)
Ok thanks for letting me know.    This is surprising as she is very symptomatic however I hope her topical therapies are working well for her.

## 2017-09-10 NOTE — Telephone Encounter (Signed)
Dr Nelva Bush wanted to start patient on [place] for her atopic dermatitis and after I received approval she was submitted to [place] on 08/20/17.  I have attempted to contact along with pharmacy but unable to leave message due to voice mail full.  I mailed letter to patient on 08/28/17 to contact me to find out how to get therapy and have not received any response back.  At this time I will assume that patient is not interested in starting [place] and in the future she decides she wants to get started she can contact me.

## 2017-09-18 ENCOUNTER — Telehealth: Payer: Self-pay | Admitting: *Deleted

## 2017-09-18 NOTE — Telephone Encounter (Signed)
Patient called back and stated she does want to start [place] therapy.  I gave her phone number to [facility] and she already set up delivery.  She advised she wants delivery to our [place] office and wants to be administered in office. I have set delivery for 2/6 and advised patient to cotnact our [place] office on 26 pm or later to schedule start.

## 2017-09-18 NOTE — Telephone Encounter (Signed)
Sounds good.  Great thanks.

## 2017-09-26 ENCOUNTER — Ambulatory Visit: Payer: Self-pay

## 2017-10-01 ENCOUNTER — Ambulatory Visit: Payer: Self-pay

## 2017-10-03 ENCOUNTER — Ambulatory Visit (INDEPENDENT_AMBULATORY_CARE_PROVIDER_SITE_OTHER): Payer: Medicaid Other | Admitting: *Deleted

## 2017-10-03 DIAGNOSIS — L209 Atopic dermatitis, unspecified: Secondary | ICD-10-CM | POA: Diagnosis not present

## 2017-10-03 NOTE — Progress Notes (Signed)
Immunotherapy   Patient Details  Name: MISHTI SWANTON MRN: [id number] Date of Birth: April 08, 1971  10/03/2017  [name] started injections for  Dupixent 600mg  given today. Patient will continue 300mg  every 2 weeks.  Epi-Pen:Epi-Pen Available  Consent signed and patient instructions given. No problems after 30 minutes in the office.   Horris Latino 10/03/2017, 5:16 PM

## 2017-10-12 ENCOUNTER — Other Ambulatory Visit: Payer: Self-pay | Admitting: Allergy

## 2017-10-12 DIAGNOSIS — L508 Other urticaria: Secondary | ICD-10-CM

## 2017-10-17 ENCOUNTER — Ambulatory Visit (INDEPENDENT_AMBULATORY_CARE_PROVIDER_SITE_OTHER): Payer: Medicaid Other | Admitting: *Deleted

## 2017-10-17 DIAGNOSIS — L209 Atopic dermatitis, unspecified: Secondary | ICD-10-CM

## 2017-10-18 NOTE — Progress Notes (Signed)
Immunotherapy   Patient Details  [name]: [name] MRN: [id number] Date of Birth: 1971-02-04  10/17/2017  [name] received Dupixent  300mg  in her right arm.  Epi-Pen:Epi-Pen Available     Horris Latino 10/18/2017, 4:32 PM

## 2017-10-31 ENCOUNTER — Ambulatory Visit (INDEPENDENT_AMBULATORY_CARE_PROVIDER_SITE_OTHER): Payer: Medicaid Other | Admitting: *Deleted

## 2017-10-31 DIAGNOSIS — L209 Atopic dermatitis, unspecified: Secondary | ICD-10-CM | POA: Diagnosis not present

## 2017-10-31 NOTE — Progress Notes (Signed)
Immunotherapy   Patient Details  Name: [name] MRN: [id number] Date of Birth: Dec 18, 1970  10/31/2017  [name] received Dupixent  300mg  in her left arm.  Epi-Pen:Epi-Pen Available      Horris Latino 10/31/2017, 6:43 PM

## 2017-11-14 ENCOUNTER — Ambulatory Visit (INDEPENDENT_AMBULATORY_CARE_PROVIDER_SITE_OTHER): Payer: Medicaid Other | Admitting: *Deleted

## 2017-11-14 DIAGNOSIS — L209 Atopic dermatitis, unspecified: Secondary | ICD-10-CM

## 2017-11-18 NOTE — Progress Notes (Signed)
Immunotherapy   Patient Details  Name: [name] MRN: [id number] Date of Birth: [birth year]  11/14/2017   Shatori L GrayreceivedDupixent 300mg  in her right arm.Epi-Pen:Epi-Pen Available   Horris Latino 11/14/2017

## 2017-11-28 ENCOUNTER — Ambulatory Visit (INDEPENDENT_AMBULATORY_CARE_PROVIDER_SITE_OTHER): Payer: Medicaid Other | Admitting: *Deleted

## 2017-11-28 DIAGNOSIS — L209 Atopic dermatitis, unspecified: Secondary | ICD-10-CM | POA: Diagnosis not present

## 2017-11-29 NOTE — Progress Notes (Signed)
Immunotherapy   Patient Details  Name: [name] MRN: [id number] Date of Birth: 11/02/1970  11/28/2017   [name] L GrayreceivedDupixent 300mg  in her leftarm.Epi-Pen:Epi-Pen Available  Horris Latino 11/28/2017,

## 2017-12-12 ENCOUNTER — Ambulatory Visit (INDEPENDENT_AMBULATORY_CARE_PROVIDER_SITE_OTHER): Payer: Medicaid Other | Admitting: *Deleted

## 2017-12-12 DIAGNOSIS — L209 Atopic dermatitis, unspecified: Secondary | ICD-10-CM | POA: Diagnosis not present

## 2017-12-26 ENCOUNTER — Ambulatory Visit: Payer: Medicaid Other

## 2017-12-26 ENCOUNTER — Ambulatory Visit (INDEPENDENT_AMBULATORY_CARE_PROVIDER_SITE_OTHER): Payer: Medicaid Other | Admitting: Allergy

## 2017-12-26 ENCOUNTER — Encounter: Payer: Self-pay | Admitting: Allergy

## 2017-12-26 VITALS — BP 126/74 | HR 102 | Temp 98.3°F | Resp 16 | Ht 59.0 in | Wt 141.0 lb

## 2017-12-26 DIAGNOSIS — J3089 Other allergic rhinitis: Secondary | ICD-10-CM | POA: Diagnosis not present

## 2017-12-26 DIAGNOSIS — J4521 Mild intermittent asthma with (acute) exacerbation: Secondary | ICD-10-CM | POA: Diagnosis not present

## 2017-12-26 DIAGNOSIS — T781XXD Other adverse food reactions, not elsewhere classified, subsequent encounter: Secondary | ICD-10-CM

## 2017-12-26 DIAGNOSIS — L2084 Intrinsic (allergic) eczema: Secondary | ICD-10-CM | POA: Diagnosis not present

## 2017-12-26 DIAGNOSIS — L508 Other urticaria: Secondary | ICD-10-CM

## 2017-12-26 MED ORDER — FLUTICASONE PROPIONATE HFA 110 MCG/ACT IN AERO: 2.0000 | INHALATION_SPRAY | Freq: Two times a day (BID) | RESPIRATORY_TRACT | 5 refills | Status: DC

## 2017-12-26 NOTE — Progress Notes (Signed)
Follow-up Note  RE: [name] MRN: [id number] DOB: September 05, 1970 Date of Office Visit: 12/26/2017   History of present illness: [name] is a 47 y.o. female presenting today for sick visit for asthma, allergies and eczema.  She also has a history of hives and PFAS. She was last seen in the office on 07/25/17 by myself.  She states for the last month she has been having "asthma attacks several times a week".  She does feel that she is reaction to the pollen exposure.  She reports symptoms of cough, wheeze and shortness of breath.  She is using her albuterol 1-2 times a day with relief of symptoms.  She is also on singulair daily.  She does not have a ICS at this time.   With her eczema she feels she is flaring.  She is on dupixent injections every 2 weeks and tolerating injections well.  Since starting on dupixent in Feb 2019 she does feel she has had improvement in the appearance of her skin. She states her back has had the best improvement and reports it is no longer itchy.  She is using hydrocortisone/eucerin compound daily which she states works the best in controlling her flares.  She last tried Elidel which she does not feel was helpful.   With her allergies she reports having runny and stuffy nose with itchy eyes.  She is using zyrtec twice a day for hive control and also using flonase daily.   She states with use of zyrtec and zantac twice a day this is controlling her hives.   She continues to avoid fresh pitted fruits as well as strawberries, bananas and kiwi.   She has access to an epipen which she has not needed to use.    Review of systems: Review of Systems  Constitutional: Negative for chills, fever and malaise/fatigue.  HENT: Positive for congestion. Negative for ear discharge, ear pain, nosebleeds, sinus pain and sore throat.   Eyes: Negative for pain, discharge and redness.  Respiratory: Positive for cough, shortness of breath and wheezing.   Cardiovascular: Negative  for chest pain.  Gastrointestinal: Negative for abdominal pain, constipation, diarrhea, heartburn, nausea and vomiting.  Musculoskeletal: Negative for joint pain.  Skin: Positive for itching and rash.  Neurological: Negative for headaches.    All other systems negative unless noted above in HPI  Past medical/social/surgical/family history have been reviewed and are unchanged unless specifically indicated below.  No changes  Medication List: Allergies as of 12/26/2017      Reactions   Plum Pulp Itching, Swelling   Prunus Persica Itching, Swelling   Strawberry Extract Itching, Swelling   Aspirin Other (See Comments)   Fever; Pt can take ibuprofen without problems.   Fruit & Vegetable Daily [nutritional Supplements] Other (See Comments)   ALL FRUIT WITH SEEDS- THROAT ITCHING   Methocarbamol Nausea Only, Other (See Comments)   sleepy   Morphine And Related Itching   Percocet [oxycodone-acetaminophen] Itching, Nausea Only   Pt can take plain Tylenol and Tylenol with codeine.   Shrimp [shellfish Allergy] Itching   THROAT ITCHING   Simvastatin Other (See Comments)   Body aches   Methocarbamol    Morphine    Oxycodone-acetaminophen    Saccharin Nausea Only      Medication List        Accurate as of 12/26/17  6:34 PM. Always use your most recent med list.          acetaminophen-codeine 300-30 MG  tablet Commonly known as:  TYLENOL #3 Take 1-2 tablets by mouth every 6 (six) hours as needed for pain.   albuterol 108 (90 Base) MCG/ACT inhaler Commonly known as:  PROAIR HFA 2 puffs by mouth every 4-6 hours as needed.   cetirizine 10 MG tablet Commonly known as:  ZYRTEC Take 1 tablet (10 mg total) by mouth 2 (two) times daily.   cyclobenzaprine 10 MG tablet Commonly known as:  FLEXERIL Take 10 mg by mouth at bedtime as needed.   diclofenac 75 MG EC tablet Commonly known as:  VOLTAREN TAKE 1 TABLET BY MOUTH TWICE A DAY AFTER MEALS FOR INFLAMMATION/PAIN/SWELLING AS NEEDED    ELIDEL 1 % cream Generic drug:  pimecrolimus Apply topically 2 (two) times daily.   fluticasone 50 MCG/ACT nasal spray Commonly known as:  FLONASE Place 1 spray into the nose daily as needed. For allergies   gabapentin 100 MG capsule Commonly known as:  NEURONTIN Take 1 capsule by mouth 2 (two) times daily.   HYDROcodone-acetaminophen 5-325 MG tablet Commonly known as:  NORCO Take 1 tablet by mouth every 6 (six) hours as needed for severe pain.   hydrocortisone 2.5 % lotion Mix 1:1 ratio with Eucerin 2107ml. Apply 2 times daily.   hydrOXYzine 10 MG tablet Commonly known as:  ATARAX/VISTARIL TAKE 2 TABLETS BY MOUTH AT BEDTIME AS NEEDED FOR ITCH   labetalol 200 MG tablet Commonly known as:  NORMODYNE Take 200 mg by mouth 2 (two) times daily.   lidocaine 2 % solution Commonly known as:  XYLOCAINE SWISH AND SPIT WITH 10 ML'S EVERY 4 HOURS AS NEEDED FOR THROAT PAIN   lisinopril-hydrochlorothiazide 10-12.5 MG tablet Commonly known as:  PRINZIDE,ZESTORETIC Take 1 tablet by mouth daily.   montelukast 10 MG tablet Commonly known as:  SINGULAIR Take 1 tablet (10 mg total) by mouth at bedtime.   mupirocin ointment 2 % Commonly known as:  BACTROBAN Apply 1 application topically as needed (for wound).   naproxen 500 MG tablet Commonly known as:  NAPROSYN Take 1 tablet (500 mg total) by mouth 2 (two) times daily as needed for mild pain, moderate pain or headache (TAKE WITH MEALS.).   nystatin-triamcinolone cream Commonly known as:  MYCOLOG II Apply to affected area (behind both ears) TID x 10 days   ondansetron 4 MG disintegrating tablet Commonly known as:  ZOFRAN-ODT ondansetron 4 mg disintegrating tablet  TAKE ONE TABLET (4 MG TOTAL) BY MOUTH EVERY 8 (EIGHT) HOURS AS NEEDED FOR NAUSEA.   pantoprazole 40 MG tablet Commonly known as:  PROTONIX Take 40 mg by mouth at bedtime.   PATADAY 0.2 % Soln Generic drug:  Olopatadine HCl Apply 1 drop to eye daily as needed (FOR  ALLERGIES).   Potassium Chloride ER 20 MEQ Tbcr Take 1 tablet by mouth 2 (two) times daily.   ranitidine 150 MG tablet Commonly known as:  ZANTAC TAKE 1 TABLET BY MOUTH TWICE A DAY   traMADol 50 MG tablet Commonly known as:  ULTRAM Take 1 tablet (50 mg total) by mouth every 6 (six) hours as needed.   ZETIA 10 MG tablet Generic drug:  ezetimibe Take 10 mg by mouth daily.       Known medication allergies: Allergies  Allergen Reactions  . Plum Pulp Itching and Swelling  . Prunus Persica Itching and Swelling  . Strawberry Extract Itching and Swelling  . Aspirin Other (See Comments)    Fever; Pt can take ibuprofen without problems.  . Fruit & Vegetable Daily [  Nutritional Supplements] Other (See Comments)    ALL FRUIT WITH SEEDS- THROAT ITCHING  . Methocarbamol Nausea Only and Other (See Comments)    sleepy  . Morphine And Related Itching  . Percocet [Oxycodone-Acetaminophen] Itching and Nausea Only    Pt can take plain Tylenol and Tylenol with codeine.  . Shrimp [Shellfish Allergy] Itching    THROAT ITCHING  . Simvastatin Other (See Comments)    Body aches  . Methocarbamol   . Morphine   . Oxycodone-Acetaminophen   . Saccharin Nausea Only     Physical examination: Blood pressure 126/74, pulse (!) 102, temperature 98.3 F (36.8 C), temperature source Oral, resp. rate 16, height 4\' 11"  (1.499 m), weight 141 lb (64 kg), SpO2 97 %.  General: Alert, interactive, in no acute distress. HEENT: PERRLA, TMs pearly Huelsmann, turbinates mildly edematous with clear discharge, post-pharynx non erythematous. Neck: Supple without lymphadenopathy. Lungs: Clear to auscultation without wheezing, rhonchi or rales. {no increased work of breathing. CV: Normal S1, S2 without murmurs. Abdomen: Nondistended, nontender. Skin: hyperpigmented patches/plaques at temples of forehead, b/l anterior thighs, right hand. Extremities:  No clubbing, cyanosis or edema. Neuro:   Grossly  intact.  Diagnositics/Labs:  Spirometry: FEV1: 2.64L 133%, FVC: 3.18L 130%, ratio consistent with nonobstructive pattern  Assessment and plan:   Urticaria with angioedema, chronic    - improved     - continue Zyrtec 10 mg twice a day along with your Zantac 150 mg twice a day and continue Singulair daily   - Continue use of hydrocortisone plus Eucerin compound to help with itch daily as well as eczema  Atopic dermatitis  - continue use of hydrocortisone plus Eucerin compound as needed with flare ups  - continue Dupixent injections every 2 weeks.  I do note improvement in her skin overall since starting on Dupixent  - continue moisturization twice a day   Allergic rhinitis  - continue avoidance measures for dust mites, cat, dog, grasses, cockroach, mold, trees, weeds  - Continue Zyrtec and Singulair as above  - Continue Flonase 2 sprays each nostril daily for nasal congestion  Asthma, mild intermittent  - currently with acute exacerbation  - start Flovent 124mcg 2 puffs twice a day  - Continue use of as needed albuterol 2 puffs every 4-6 hours as needed for cough, wheeze, shortness of breath or chest tightness. Monitor frequency of use.  Will refill your albuterol today  - Singulair as above  - take prednisone pack as directed (20mg  bid x 3 days, 20mg  x 1 day, 10mg  x 1 day and stop)   Asthma control goals:   Full participation in all desired activities (may need albuterol before activity)  Albuterol use two time or less a week on average (not counting use with activity)  Cough interfering with sleep two time or less a month  Oral steroids no more than once a year  No hospitalizations  Pollen food allergy syndrome   - Continue avoidance of fresh pitted fruits as well as strawberries and bananas and kiwis    - most are able to tolerate these foods in cooked forms  Follow-up 4-6 months or sooner if needed  I appreciate the opportunity to take part in Neleh's care. Please  do not hesitate to contact me with questions.  Sincerely,   Prudy Feeler, MD Allergy/Immunology [facility]

## 2017-12-26 NOTE — Patient Instructions (Signed)
Hives     - improved     - continue Zyrtec 10 mg twice a day along with your Zantac 150 mg twice a day and continue Singulair daily   - Continue use of hydrocortisone plus Eucerin compound to help with itch daily as well as eczema  Eczema  - continue use of hydrocortisone plus Eucerin compound as needed with flare ups  - continue Dupixent injections every 2 weeks.  I do note improvement in your skin overall since starting on Dupixent  - continue moisturization twice a day   Allergies  - continue avoidance measures for dust mites, cat, dog, grasses, cockroach, mold, trees, weeds  - Continue Zyrtec and Singulair as above  - Continue Flonase 2 sprays each nostril daily for nasal congestion  Asthma  - currently with acute exacerbation  - start Flovent 162mcg 2 puffs twice a day  - Continue use of as needed albuterol 2 puffs every 4-6 hours as needed for cough, wheeze, shortness of breath or chest tightness. Monitor frequency of use.  Will refill your albuterol today  - Singulair as above  - take prednisone pack as directed  Asthma control goals:   Full participation in all desired activities (may need albuterol before activity)  Albuterol use two time or less a week on average (not counting use with activity)  Cough interfering with sleep two time or less a month  Oral steroids no more than once a year  No hospitalizations  Pollen food allergy syndrome   - Continue avoidance of fresh pitted fruits as well as strawberries and bananas and kiwis    - most are able to tolerate these foods in cooked forms  Follow-up 4-6 months or sooner if needed

## 2018-01-09 ENCOUNTER — Ambulatory Visit: Payer: Self-pay

## 2018-01-10 ENCOUNTER — Ambulatory Visit (INDEPENDENT_AMBULATORY_CARE_PROVIDER_SITE_OTHER): Payer: Medicaid Other

## 2018-01-10 DIAGNOSIS — L209 Atopic dermatitis, unspecified: Secondary | ICD-10-CM

## 2018-01-23 ENCOUNTER — Ambulatory Visit (INDEPENDENT_AMBULATORY_CARE_PROVIDER_SITE_OTHER): Payer: Medicaid Other

## 2018-01-23 DIAGNOSIS — L209 Atopic dermatitis, unspecified: Secondary | ICD-10-CM | POA: Diagnosis not present

## 2018-01-24 ENCOUNTER — Ambulatory Visit: Payer: Self-pay

## 2018-02-06 ENCOUNTER — Ambulatory Visit (INDEPENDENT_AMBULATORY_CARE_PROVIDER_SITE_OTHER): Payer: Medicaid Other | Admitting: *Deleted

## 2018-02-06 DIAGNOSIS — L209 Atopic dermatitis, unspecified: Secondary | ICD-10-CM

## 2018-02-09 ENCOUNTER — Other Ambulatory Visit: Payer: Self-pay | Admitting: Allergy

## 2018-02-09 DIAGNOSIS — L508 Other urticaria: Secondary | ICD-10-CM

## 2018-02-18 ENCOUNTER — Ambulatory Visit: Payer: Medicaid Other

## 2018-02-19 ENCOUNTER — Ambulatory Visit (INDEPENDENT_AMBULATORY_CARE_PROVIDER_SITE_OTHER): Payer: Medicaid Other

## 2018-02-19 DIAGNOSIS — L209 Atopic dermatitis, unspecified: Secondary | ICD-10-CM | POA: Diagnosis not present

## 2018-03-05 ENCOUNTER — Ambulatory Visit (INDEPENDENT_AMBULATORY_CARE_PROVIDER_SITE_OTHER): Payer: Medicaid Other

## 2018-03-05 DIAGNOSIS — L209 Atopic dermatitis, unspecified: Secondary | ICD-10-CM

## 2018-03-20 ENCOUNTER — Ambulatory Visit (INDEPENDENT_AMBULATORY_CARE_PROVIDER_SITE_OTHER): Payer: Medicaid Other | Admitting: *Deleted

## 2018-03-20 DIAGNOSIS — L209 Atopic dermatitis, unspecified: Secondary | ICD-10-CM

## 2018-03-26 ENCOUNTER — Ambulatory Visit (INDEPENDENT_AMBULATORY_CARE_PROVIDER_SITE_OTHER): Payer: Medicaid Other | Admitting: Nurse Practitioner

## 2018-03-26 ENCOUNTER — Encounter: Payer: Self-pay | Admitting: Nurse Practitioner

## 2018-03-26 VITALS — BP 118/72 | HR 65 | Ht 60.0 in | Wt 138.8 lb

## 2018-03-26 DIAGNOSIS — R0602 Shortness of breath: Secondary | ICD-10-CM

## 2018-03-26 DIAGNOSIS — I10 Essential (primary) hypertension: Secondary | ICD-10-CM

## 2018-03-26 DIAGNOSIS — R0789 Other chest pain: Secondary | ICD-10-CM | POA: Diagnosis not present

## 2018-03-26 NOTE — Patient Instructions (Addendum)
We will be checking the following labs today - BMET, CBC, sed rate & BNP   Medication Instructions:    Continue with your current medicines.     Testing/Procedures To Be Arranged:  Cardiac CT   Please arrive at the [facility] main entrance of [facility] at _______________ AM (30-45 minutes prior to test start time)  [facility] [place], [place] [address] [phone]  Proceed to the [facility] Radiology Department (First Floor).  Please follow these instructions carefully (unless otherwise directed):   On the Night Before the Test: . Drink plenty of water. . Do not consume any caffeinated/decaffeinated beverages or chocolate 12 hours prior to your test. . Do not take any antihistamines 12 hours prior to your test. . If you take Metformin do not take 24 hours prior to test. . If the patient has contrast allergy: ? Patient will need a prescription for Prednisone and very clear instructions (as follows): 1. Prednisone 50 mg - take 13 hours prior to test 2. Take another Prednisone 50 mg 7 hours prior to test 3. Take another Prednisone 50 mg 1 hour prior to test 4. Take Benadryl 50 mg 1 hour prior to test . Patient must complete all four doses of above prophylactic medications. . Patient will need a ride after test due to Benadryl.  On the Day of the Test: . Drink plenty of water. Do not drink any water within one hour of the test. . Do not eat any food 4 hours prior to the test. . You may take your regular medications prior to the test. .  . HOLD Furosemide morning of the test.  After the Test: . Drink plenty of water. . After receiving IV contrast, you may experience a mild flushed feeling. This is normal. . On occasion, you may experience a mild rash up to 24 hours after the test. This is not dangerous. If this occurs, you can take Benadryl 25 mg and increase your fluid intake. . If you experience trouble breathing, this can be  serious. If it is severe call 911 IMMEDIATELY. If it is mild, please call our office. . If you take any of these medications: Glipizide/Metformin, Avandament, Glucavance, please do not take 48 hours after completing test.        If you need a refill on your cardiac medications before your next appointment, please call your pharmacy.   Call the [place] office at [phone] if you have any questions, problems or concerns.

## 2018-03-26 NOTE — Progress Notes (Signed)
CARDIOLOGY OFFICE NOTE  Date:  03/26/2018    [name] Date of Birth: January 24, 1971 Medical Record #[id number]  PCP:  Selinda Orion  Cardiologist:  Johnsie Cancel    Chief Complaint  Patient presents with  . Follow-up    Seen for Dr. Johnsie Cancel    History of Present Illness: [name] is a 47 y.o. female who presents today for a 6 month check. Seen for Dr. Johnsie Cancel.   She has a history of HTN, chronic urticaria with angioedema, asthma, and CRF.   Last seen in November by Dr. Johnsie Cancel - had had chronic atypicalychest pain - stress echo was done - this was normal.   Comes in today. Here with her child. She notes that her chest pain has continued - basically constant - every day - all day - since last visit here back in November. It moves around. It feels like pins and needles, sometime pressure like, worse with movement/twisting or not doing anything at all. Some shortness of breath - but also with asthma. On multitude of medicines. Notes her PCP put her on Lasix for swelling. She is also on HCTZ. ?how much salt she is using. She is on chronic NSAID as well. She does not use support stockings. She is worried about infection as well as something being wrong with her heart. Does not smoke.   Past Medical History:  Diagnosis Date  . Arthritis    degenerative disc disease-work related injury  . Asthma    controlled-last used rescue inhaler 3 months ago  . Eczema   . Fibroids   . GERD (gastroesophageal reflux disease)    zantac/pepcid prn  . Headache(784.0)   . High cholesterol   . Hypertension   . Pre-eclampsia   . Pre-eclampsia   . Urticaria     Past Surgical History:  Procedure Laterality Date  . CESAREAN SECTION  2011  . fibroidectomy    . SPINAL FUSION  2006   L4-5  . WISDOM TOOTH EXTRACTION       Medications: Current Meds  Medication Sig  . acetaminophen-codeine (TYLENOL #3) 300-30 MG per tablet Take 1-2 tablets by mouth every 6 (six) hours as needed for  pain.  Marland Kitchen albuterol (PROAIR HFA) 108 (90 Base) MCG/ACT inhaler 2 puffs by mouth every 4-6 hours as needed.  . cetirizine (ZYRTEC) 10 MG tablet TAKE 1 TABLET BY MOUTH TWICE A DAY  . cyclobenzaprine (FLEXERIL) 10 MG tablet Take 10 mg by mouth at bedtime as needed.  . diclofenac (VOLTAREN) 75 MG EC tablet TAKE 1 TABLET BY MOUTH TWICE A DAY AFTER MEALS FOR INFLAMMATION/PAIN/SWELLING AS NEEDED  . ELIDEL 1 % cream Apply topically 2 (two) times daily.  . fluticasone (FLONASE) 50 MCG/ACT nasal spray Place 1 spray into the nose daily as needed. For allergies  . fluticasone (FLOVENT HFA) 110 MCG/ACT inhaler Inhale 2 puffs into the lungs 2 (two) times daily.  Marland Kitchen HYDROcodone-acetaminophen (NORCO) 5-325 MG per tablet Take 1 tablet by mouth every 6 (six) hours as needed for severe pain.  . hydrocortisone 2.5 % lotion Mix 1:1 ratio with Eucerin 277ml. Apply 2 times daily.  . hydrOXYzine (ATARAX/VISTARIL) 10 MG tablet TAKE 2 TABLETS BY MOUTH AT BEDTIME AS NEEDED FOR ITCH  . labetalol (NORMODYNE) 200 MG tablet Take 200 mg by mouth 2 (two) times daily.  Marland Kitchen lidocaine (XYLOCAINE) 2 % solution SWISH AND SPIT WITH 10 ML'S EVERY 4 HOURS AS NEEDED FOR THROAT PAIN  . lisinopril-hydrochlorothiazide (  PRINZIDE,ZESTORETIC) 10-12.5 MG tablet Take 1 tablet by mouth daily.  . montelukast (SINGULAIR) 10 MG tablet Take 1 tablet (10 mg total) by mouth at bedtime.  . mupirocin ointment (BACTROBAN) 2 % Apply 1 application topically as needed (for wound).   . naproxen (NAPROSYN) 500 MG tablet Take 1 tablet (500 mg total) by mouth 2 (two) times daily as needed for mild pain, moderate pain or headache (TAKE WITH MEALS.).  Marland Kitchen nystatin-triamcinolone (MYCOLOG II) cream Apply to affected area (behind both ears) TID x 10 days  . Olopatadine HCl (PATADAY) 0.2 % SOLN Apply 1 drop to eye daily as needed (FOR ALLERGIES).  Marland Kitchen ondansetron (ZOFRAN-ODT) 4 MG disintegrating tablet ondansetron 4 mg disintegrating tablet  TAKE ONE TABLET (4 MG TOTAL) BY  MOUTH EVERY 8 (EIGHT) HOURS AS NEEDED FOR NAUSEA.  . pantoprazole (PROTONIX) 40 MG tablet Take 40 mg by mouth at bedtime.  . potassium chloride SA (KLOR-CON M20) 20 MEQ tablet TAKE 1 TABLET BY MOUTH TWICE A DAY  . ranitidine (ZANTAC) 150 MG tablet TAKE 1 TABLET BY MOUTH TWICE A DAY  . traMADol (ULTRAM) 50 MG tablet Take 1 tablet (50 mg total) by mouth every 6 (six) hours as needed.  Marland Kitchen ZETIA 10 MG tablet Take 10 mg by mouth daily.  . [DISCONTINUED] Potassium Chloride ER 20 MEQ TBCR Take 1 tablet by mouth 2 (two) times daily.     Allergies: Allergies  Allergen Reactions  . Plum Pulp Itching and Swelling  . Prunus Persica Itching and Swelling  . Strawberry Extract Itching and Swelling  . Aspirin Other (See Comments)    Fever; Pt can take ibuprofen without problems.  . Fruit & Vegetable Daily [Nutritional Supplements] Other (See Comments)    ALL FRUIT WITH SEEDS- THROAT ITCHING  . Methocarbamol Nausea Only and Other (See Comments)    sleepy  . Morphine And Related Itching  . Percocet [Oxycodone-Acetaminophen] Itching and Nausea Only    Pt can take plain Tylenol and Tylenol with codeine.  . Shrimp [Shellfish Allergy] Itching    THROAT ITCHING  . Simvastatin Other (See Comments)    Body aches  . Methocarbamol   . Morphine   . Oxycodone-Acetaminophen   . Saccharin Nausea Only    Social History: The patient  reports that she has never smoked. She has never used smokeless tobacco. She reports that she does not drink alcohol or use drugs.   Family History: The patient's family history includes Breast cancer in her maternal aunt; High Cholesterol in her mother and other; Hypertension in her mother and other; Liver disease in her father; Pancreatic cancer in her maternal uncle; Pneumonia in her father; Stroke in her other.   Review of Systems: Please see the history of present illness.   Otherwise, the review of systems is positive for none.   All other systems are reviewed and  negative.   Physical Exam: VS:  BP 118/72 (BP Location: Left Arm, Patient Position: Sitting, Cuff Size: Normal)   Pulse 65   Ht 5' (1.524 m)   Wt 138 lb 12.8 oz (63 kg)   BMI 27.11 kg/m  .  BMI Body mass index is 27.11 kg/m.  Wt Readings from Last 3 Encounters:  03/26/18 138 lb 12.8 oz (63 kg)  12/26/17 141 lb (64 kg)  07/18/17 137 lb 12 oz (62.5 kg)    General: Alert and in no acute distress.   HEENT: Normal.  Neck: Supple, no JVD, carotid bruits, or masses noted.  Cardiac:  Regular rate and rhythm. No murmurs, rubs, or gallops. No edema.  Respiratory:  Lungs are clear to auscultation bilaterally with normal work of breathing.  GI: Soft and nontender.  MS: No deformity or atrophy. Gait and ROM intact.  Skin: Warm and dry. Color is normal.  Neuro:  Strength and sensation are intact and no gross focal deficits noted.  Psych: Alert, appropriate and with normal affect.   LABORATORY DATA:  EKG:  EKG is ordered today. This shows NSR.   Lab Results  Component Value Date   WBC 6.1 12/27/2016   HGB 10.6 (L) 12/27/2016   HCT 31.8 (L) 12/27/2016   PLT 261 12/27/2016   GLUCOSE 89 12/27/2016   ALT 94 (H) 12/27/2016   AST 59 (H) 12/27/2016   NA 138 12/27/2016   K 4.0 12/27/2016   CL 105 12/27/2016   CREATININE 0.86 12/27/2016   BUN 8 12/27/2016   CO2 24 12/27/2016   TSH 1.30 12/27/2016     BNP (last 3 results) No results for input(s): BNP in the last 8760 hours.  ProBNP (last 3 results) No results for input(s): PROBNP in the last 8760 hours.   Other Studies Reviewed Today:  Stress Echo Study Conclusions 07/2017  - Left ventricle: The cavity size was normal. Wall thickness was   normal. Systolic function was normal. The estimated ejection   fraction was in the range of 55% to 60%. Wall motion was normal;   there were no regional wall motion abnormalities. - Stress ECG conclusions: There were no stress arrhythmias or   conduction abnormalities. The stress ECG was  non-diagnostic. - Staged echo: There was no echocardiographic evidence for   stress-induced ischemia.  Impressions:  - Stress echo with no chest pain; borderline ST changes in the   inferolateral leads; however no stress-induced wall motion   abnormalities.  Notes recorded by Josue Hector, MD on 08/08/2017 at 5:21 PM EST Normal stress echo   Assessment/Plan:  1. Chest pain - very atypical - she had a negative stress echo in December. Will arrange for cardiac CT. She denies pregnancy. EKG is reassuring.   2. Lower extremity edema - normal LV systolic function on echo. Would try to not have on 2 diuretics. Checking lab today. Suggested support stockings and salt restriction and avoidance of NSAIDs.    3. Asthma  4. Urticaria with angioedema  5. HTN - BP ok on current regimen. She had severe preeclampsia with her pregnancy. Needs good BP control going forward.   Current medicines are reviewed with the patient today.  The patient does not have concerns regarding medicines other than what has been noted above.  The following changes have been made:  See above.  Labs/ tests ordered today include:    Orders Placed This Encounter  Procedures  . CT CORONARY MORPH W/CTA COR W/SCORE W/CA W/CM &/OR WO/CM  . CT CORONARY FRACTIONAL FLOW RESERVE DATA PREP  . CT CORONARY FRACTIONAL FLOW RESERVE FLUID ANALYSIS  . Basic metabolic panel  . CBC  . Pro b natriuretic peptide (BNP)  . Sedimentation rate  . EKG 12-Lead     Disposition:   Further disposition pending.   Patient is agreeable to this plan and will call if any problems develop in the interim.   SignedTruitt Merle, NP  03/26/2018 4:25 PM  [place] [address] [place], [place]  [address] Phone: [phone] Fax: [phone]

## 2018-03-27 LAB — CBC
Hematocrit: 31 % — ABNORMAL LOW (ref 34.0–46.6)
Hemoglobin: 10.5 g/dL — ABNORMAL LOW (ref 11.1–15.9)
MCH: 28 pg (ref 26.6–33.0)
MCHC: 33.9 g/dL (ref 31.5–35.7)
MCV: 83 fL (ref 79–97)
Platelets: 299 10*3/uL (ref 150–450)
RBC: 3.75 x10E6/uL — ABNORMAL LOW (ref 3.77–5.28)
RDW: 13.4 % (ref 12.3–15.4)
WBC: 5.9 10*3/uL (ref 3.4–10.8)

## 2018-03-27 LAB — BASIC METABOLIC PANEL
BUN/Creatinine Ratio: 9 (ref 9–23)
BUN: 7 mg/dL (ref 6–24)
CO2: 24 mmol/L (ref 20–29)
Calcium: 9.2 mg/dL (ref 8.7–10.2)
Chloride: 104 mmol/L (ref 96–106)
Creatinine, Ser: 0.81 mg/dL (ref 0.57–1.00)
GFR calc Af Amer: 101 mL/min/{1.73_m2} (ref >59)
GFR calc non Af Amer: 87 mL/min/{1.73_m2} (ref >59)
Glucose: 93 mg/dL (ref 65–99)
Potassium: 4.2 mmol/L (ref 3.5–5.2)
Sodium: 140 mmol/L (ref 134–144)

## 2018-03-27 LAB — SEDIMENTATION RATE: Sed Rate: 12 mm/hr (ref 0–32)

## 2018-03-27 LAB — PRO B NATRIURETIC PEPTIDE: NT-Pro BNP: 58 pg/mL (ref 0–249)

## 2018-04-03 ENCOUNTER — Ambulatory Visit (INDEPENDENT_AMBULATORY_CARE_PROVIDER_SITE_OTHER): Payer: Medicaid Other | Admitting: *Deleted

## 2018-04-03 DIAGNOSIS — L209 Atopic dermatitis, unspecified: Secondary | ICD-10-CM

## 2018-04-10 ENCOUNTER — Telehealth: Payer: Self-pay | Admitting: Nurse Practitioner

## 2018-04-10 NOTE — Telephone Encounter (Signed)
Follow up ° °Patient is returning call for lab results. °

## 2018-04-10 NOTE — Telephone Encounter (Signed)
Informed pt of results. Pt verbalized understanding. Advised pt I will send labs to PCP.  She will contact PCP for f/u.

## 2018-04-10 NOTE — Telephone Encounter (Signed)
New Message:   Pt returning call for labs results

## 2018-04-10 NOTE — Telephone Encounter (Signed)
Left message to call back  

## 2018-04-17 ENCOUNTER — Ambulatory Visit (INDEPENDENT_AMBULATORY_CARE_PROVIDER_SITE_OTHER): Payer: Medicaid Other | Admitting: *Deleted

## 2018-04-17 DIAGNOSIS — L209 Atopic dermatitis, unspecified: Secondary | ICD-10-CM | POA: Diagnosis not present

## 2018-05-01 ENCOUNTER — Ambulatory Visit (INDEPENDENT_AMBULATORY_CARE_PROVIDER_SITE_OTHER): Payer: Medicaid Other | Admitting: *Deleted

## 2018-05-01 DIAGNOSIS — L209 Atopic dermatitis, unspecified: Secondary | ICD-10-CM

## 2018-05-15 ENCOUNTER — Ambulatory Visit (INDEPENDENT_AMBULATORY_CARE_PROVIDER_SITE_OTHER): Payer: Medicaid Other | Admitting: *Deleted

## 2018-05-15 DIAGNOSIS — L209 Atopic dermatitis, unspecified: Secondary | ICD-10-CM

## 2018-05-22 ENCOUNTER — Telehealth: Payer: Self-pay | Admitting: *Deleted

## 2018-05-22 DIAGNOSIS — I10 Essential (primary) hypertension: Secondary | ICD-10-CM

## 2018-05-22 NOTE — Telephone Encounter (Signed)
lvm to go over all pt's instructions for upcoming CTA and to schedule an appointment to get a bmet that needs to be done before testing.  Order placed in system for bmet just has to be scheduled and linked.  Please see phone note.

## 2018-05-28 ENCOUNTER — Telehealth: Payer: Self-pay | Admitting: *Deleted

## 2018-05-28 NOTE — Telephone Encounter (Signed)
lvm pt needs labs prior to upcoming procedure.

## 2018-05-29 ENCOUNTER — Ambulatory Visit (INDEPENDENT_AMBULATORY_CARE_PROVIDER_SITE_OTHER): Payer: Medicaid Other | Admitting: *Deleted

## 2018-05-29 DIAGNOSIS — L209 Atopic dermatitis, unspecified: Secondary | ICD-10-CM

## 2018-06-02 DIAGNOSIS — J301 Allergic rhinitis due to pollen: Secondary | ICD-10-CM

## 2018-06-02 NOTE — Telephone Encounter (Signed)
lvm pt needs to schedule a  bmet prior to CTA on Oct 24.

## 2018-06-09 ENCOUNTER — Other Ambulatory Visit: Payer: Self-pay | Admitting: *Deleted

## 2018-06-09 DIAGNOSIS — I10 Essential (primary) hypertension: Secondary | ICD-10-CM

## 2018-06-09 NOTE — Telephone Encounter (Signed)
Pt calling in today to go over medications for upcoming procedure.  Pt is to come in for a BMET tomorrow which is needed for upcoming CTA, appt made orders in and linked. Also will leave copy of instructions for upcoming test up front and pt will pick this up tomorrow. Went over instructions in length.

## 2018-06-09 NOTE — Telephone Encounter (Signed)
Mailbox is full will try tomorrow.

## 2018-06-10 ENCOUNTER — Other Ambulatory Visit: Payer: Medicaid Other

## 2018-06-10 DIAGNOSIS — I10 Essential (primary) hypertension: Secondary | ICD-10-CM

## 2018-06-11 ENCOUNTER — Telehealth: Payer: Self-pay | Admitting: *Deleted

## 2018-06-11 LAB — BASIC METABOLIC PANEL
BUN/Creatinine Ratio: 9 (ref 9–23)
BUN: 7 mg/dL (ref 6–24)
CO2: 22 mmol/L (ref 20–29)
Calcium: 9.2 mg/dL (ref 8.7–10.2)
Chloride: 102 mmol/L (ref 96–106)
Creatinine, Ser: 0.81 mg/dL (ref 0.57–1.00)
GFR calc Af Amer: 100 mL/min/{1.73_m2} (ref >59)
GFR calc non Af Amer: 87 mL/min/{1.73_m2} (ref >59)
Glucose: 87 mg/dL (ref 65–99)
Potassium: 3.6 mmol/L (ref 3.5–5.2)
Sodium: 140 mmol/L (ref 134–144)

## 2018-06-11 NOTE — Telephone Encounter (Signed)
lvm bmet results were ok for upcoming CTA.

## 2018-06-12 ENCOUNTER — Encounter: Payer: Medicaid Other | Admitting: *Deleted

## 2018-06-12 ENCOUNTER — Ambulatory Visit (HOSPITAL_COMMUNITY)
Admission: RE | Admit: 2018-06-12 | Discharge: 2018-06-12 | Disposition: A | Discharge status: Home / Self Care | Payer: Medicaid Other | Source: Ambulatory Visit | Attending: Nurse Practitioner | Admitting: Nurse Practitioner

## 2018-06-12 ENCOUNTER — Ambulatory Visit: Payer: Medicaid Other

## 2018-06-12 DIAGNOSIS — R079 Chest pain, unspecified: Secondary | ICD-10-CM | POA: Insufficient documentation

## 2018-06-12 DIAGNOSIS — Z006 Encounter for examination for normal comparison and control in clinical research program: Secondary | ICD-10-CM

## 2018-06-12 DIAGNOSIS — R0789 Other chest pain: Secondary | ICD-10-CM

## 2018-06-12 DIAGNOSIS — I10 Essential (primary) hypertension: Secondary | ICD-10-CM | POA: Insufficient documentation

## 2018-06-12 DIAGNOSIS — R0602 Shortness of breath: Secondary | ICD-10-CM | POA: Diagnosis present

## 2018-06-12 MED ORDER — IOPAMIDOL (ISOVUE-370) INJECTION 76%
80.0000 mL | Freq: Once | INTRAVENOUS | Status: AC | PRN
  Administered 2018-06-12: 100 mL via INTRAVENOUS

## 2018-06-12 MED ORDER — NITROGLYCERIN 0.4 MG SL SUBL
0.8000 mg | SUBLINGUAL_TABLET | Freq: Once | SUBLINGUAL | Status: AC
  Administered 2018-06-12: 0.8 mg via SUBLINGUAL
  Filled 2018-06-12: qty 25

## 2018-06-12 MED ORDER — NITROGLYCERIN 0.4 MG SL SUBL
SUBLINGUAL_TABLET | SUBLINGUAL | Status: AC
  Filled 2018-06-12: qty 2

## 2018-06-12 NOTE — Research (Signed)
Subject Name: [name]  Subject met inclusion and exclusion criteria.  The informed consent form, study requirements and expectations were reviewed with the subject and questions and concerns were addressed prior to the signing of the consent form.  The subject verbalized understanding of the trial requirements.  The subject agreed to participate in the [email or web address] trial and signed the informed consent at 1422 on 06/12/2018.  The informed consent was obtained prior to performance of any protocol-specific procedures for the subject.  A copy of the signed informed consent was given to the subject and a copy was placed in the subject's medical record.   Star Age [place]

## 2018-06-12 NOTE — Progress Notes (Signed)
CT complete. Patient having snack and drink at this time. Denies any complaints.

## 2018-06-17 ENCOUNTER — Ambulatory Visit (INDEPENDENT_AMBULATORY_CARE_PROVIDER_SITE_OTHER): Payer: Medicaid Other | Admitting: *Deleted

## 2018-06-17 DIAGNOSIS — L209 Atopic dermatitis, unspecified: Secondary | ICD-10-CM

## 2018-07-01 ENCOUNTER — Ambulatory Visit (INDEPENDENT_AMBULATORY_CARE_PROVIDER_SITE_OTHER): Payer: Medicaid Other | Admitting: *Deleted

## 2018-07-01 DIAGNOSIS — L209 Atopic dermatitis, unspecified: Secondary | ICD-10-CM

## 2018-07-15 ENCOUNTER — Ambulatory Visit (INDEPENDENT_AMBULATORY_CARE_PROVIDER_SITE_OTHER): Payer: Medicaid Other | Admitting: *Deleted

## 2018-07-15 DIAGNOSIS — L209 Atopic dermatitis, unspecified: Secondary | ICD-10-CM

## 2018-07-24 ENCOUNTER — Ambulatory Visit (INDEPENDENT_AMBULATORY_CARE_PROVIDER_SITE_OTHER): Payer: Medicaid Other | Admitting: Allergy

## 2018-07-24 ENCOUNTER — Encounter: Payer: Self-pay | Admitting: Allergy

## 2018-07-24 VITALS — BP 160/94 | HR 90 | Resp 12

## 2018-07-24 DIAGNOSIS — J4520 Mild intermittent asthma, uncomplicated: Secondary | ICD-10-CM | POA: Diagnosis not present

## 2018-07-24 DIAGNOSIS — J3089 Other allergic rhinitis: Secondary | ICD-10-CM

## 2018-07-24 DIAGNOSIS — T781XXD Other adverse food reactions, not elsewhere classified, subsequent encounter: Secondary | ICD-10-CM

## 2018-07-24 DIAGNOSIS — L508 Other urticaria: Secondary | ICD-10-CM

## 2018-07-24 DIAGNOSIS — L2089 Other atopic dermatitis: Secondary | ICD-10-CM | POA: Diagnosis not present

## 2018-07-24 DIAGNOSIS — R030 Elevated blood-pressure reading, without diagnosis of hypertension: Secondary | ICD-10-CM

## 2018-07-24 MED ORDER — FAMOTIDINE 20 MG PO TABS: 20.0000 mg | ORAL_TABLET | Freq: Two times a day (BID) | ORAL | 5 refills | Status: DC

## 2018-07-24 MED ORDER — ONDANSETRON 4 MG PO TBDP: ORAL_TABLET | ORAL | 0 refills | Status: DC

## 2018-07-24 NOTE — Progress Notes (Signed)
Follow-up Note  RE: IRVA LOSER MRN: [id number] DOB: Aug 13, 1971 Date of Office Visit: 07/24/2018   History of present illness: [name] is a 47 y.o. female presenting today for follow-up of urticaria, eczema, allergic rhinitis, asthma and pollen food allergy syndrome.  She was last seen in the office on 12/26/17 by myself.  She has not had any major health changes, surgeries or hospitalizations.  She states with her urticaria she has not had any episodes since her last visit and she continues to take Zyrtec twice a day as well as Zantac twice a day and singular daily. With her eczema she states that it is continuing to improve with Dupixent use.  She still is concerned about the darkness over her forehead and on her hands.  She states she still has some itching of her face, wrists and her hands.  She does use hydroxyzine at night which does help.  She does moisturize throughout the day.  She states the only combination of topical therapies that have been effective for her is hydrocortisone plus Eucerin. With her allergic rhinitis she does not complain of any symptoms at this time.  She does have Flonase for as needed use as well as the Zyrtec and the Singulair that she takes daily as above. With her asthma she states that this is been doing well since initiating Flovent 110 mcg taking 2 puffs twice a day.  She states she still has some occasional wheezing but it is improved.  She states she has needed to use her albuterol less.  She denies any nighttime awakenings.  She has not needed any further steroids since her last visit with [place]. She continues to avoid fresh pitted foods   Review of systems: Review of Systems  Constitutional: Negative for chills, fever and malaise/fatigue.  HENT: Negative for congestion, ear discharge, ear pain, nosebleeds and sore throat.   Eyes: Negative for pain, discharge and redness.  Respiratory: Positive for shortness of breath and wheezing. Negative for  cough and sputum production.   Cardiovascular: Negative for chest pain.  Gastrointestinal: Negative for abdominal pain, constipation, diarrhea, heartburn, nausea and vomiting.  Musculoskeletal: Negative for joint pain.  Skin: Positive for itching and rash.  Neurological: Negative for headaches.    All other systems negative unless noted above in HPI  Past medical/social/surgical/family history have been reviewed and are unchanged unless specifically indicated below.  No changes  Medication List: Allergies as of 07/24/2018      Reactions   Plum Pulp Itching, Swelling   Prunus Persica Itching, Swelling   Strawberry Extract Itching, Swelling   Aspirin Other (See Comments)   Fever; Pt can take ibuprofen without problems.   Fruit & Vegetable Daily [nutritional Supplements] Other (See Comments)   ALL FRUIT WITH SEEDS- THROAT ITCHING   Methocarbamol Nausea Only, Other (See Comments)   sleepy   Morphine And Related Itching   Percocet [oxycodone-acetaminophen] Itching, Nausea Only   Pt can take plain Tylenol and Tylenol with codeine.   Shrimp [shellfish Allergy] Itching   THROAT ITCHING   Simvastatin Other (See Comments)   Body aches   Methocarbamol    Morphine    Oxycodone-acetaminophen    Saccharin Nausea Only      Medication List        Accurate as of 07/24/18  5:22 PM. Always use your most recent med list.          acetaminophen-codeine 300-30 MG tablet Commonly known as:  TYLENOL #3  Take 1-2 tablets by mouth every 6 (six) hours as needed for pain.   albuterol 108 (90 Base) MCG/ACT inhaler Commonly known as:  PROVENTIL HFA;VENTOLIN HFA 2 puffs by mouth every 4-6 hours as needed.   cetirizine 10 MG tablet Commonly known as:  ZYRTEC TAKE 1 TABLET BY MOUTH TWICE A DAY   cyclobenzaprine 10 MG tablet Commonly known as:  FLEXERIL Take 10 mg by mouth at bedtime as needed.   diclofenac 75 MG EC tablet Commonly known as:  VOLTAREN TAKE 1 TABLET BY MOUTH TWICE A DAY  AFTER MEALS FOR INFLAMMATION/PAIN/SWELLING AS NEEDED   DUPILUMAB (ASTHMA) [place] Inject into the skin.   EPINEPHrine 0.3 mg/0.3 mL [place] injection Commonly known as:  EPI-PEN Use as directed, if administer call 911   fluticasone 110 MCG/ACT inhaler Commonly known as:  FLOVENT HFA Inhale 2 puffs into the lungs 2 (two) times daily.   fluticasone 50 MCG/ACT nasal spray Commonly known as:  FLONASE Place 1 spray into the nose daily as needed. For allergies   furosemide 20 MG tablet Commonly known as:  LASIX TAKE ONE TABLET (20 MG DOSE) BY MOUTH DAILY.   hydrochlorothiazide 25 MG tablet Commonly known as:  HYDRODIURIL hydrochlorothiazide 25 mg tablet  TAKE 1 TABLET BY MOUTH DAILY   HYDROcodone-acetaminophen 5-325 MG tablet Commonly known as:  NORCO/VICODIN Take 1 tablet by mouth every 6 (six) hours as needed for severe pain.   hydrocortisone 2.5 % lotion Mix 1:1 ratio with Eucerin 220ml. Apply 2 times daily.   hydrOXYzine 10 MG tablet Commonly known as:  ATARAX/VISTARIL TAKE 2 TABLETS BY MOUTH AT BEDTIME AS NEEDED FOR ITCH   KLOR-CON M20 20 MEQ tablet Generic drug:  potassium chloride SA TAKE 1 TABLET BY MOUTH TWICE A DAY   labetalol 200 MG tablet Commonly known as:  NORMODYNE Take 200 mg by mouth 2 (two) times daily.   lidocaine 2 % solution Commonly known as:  XYLOCAINE SWISH AND SPIT WITH 10 ML'S EVERY 4 HOURS AS NEEDED FOR THROAT PAIN   lisinopril-hydrochlorothiazide 10-12.5 MG tablet Commonly known as:  PRINZIDE,ZESTORETIC Take 1 tablet by mouth daily.   meloxicam 7.5 MG tablet Commonly known as:  MOBIC meloxicam 7.5 mg tablet  TAKE 1 TAB BY MOUTH DAILY WITH FOOD   montelukast 10 MG tablet Commonly known as:  SINGULAIR Take 1 tablet (10 mg total) by mouth at bedtime.   mupirocin ointment 2 % Commonly known as:  BACTROBAN Apply 1 application topically as needed (for wound).   naproxen 500 MG tablet Commonly known as:  NAPROSYN Take 1 tablet (500 mg total)  by mouth 2 (two) times daily as needed for mild pain, moderate pain or headache (TAKE WITH MEALS.).   nystatin-triamcinolone cream Commonly known as:  MYCOLOG II Apply to affected area (behind both ears) TID x 10 days   ondansetron 4 MG disintegrating tablet Commonly known as:  ZOFRAN-ODT ondansetron 4 mg disintegrating tablet  TAKE ONE TABLET (4 MG TOTAL) BY MOUTH EVERY 8 (EIGHT) HOURS AS NEEDED FOR NAUSEA.   pantoprazole 40 MG tablet Commonly known as:  PROTONIX Take 40 mg by mouth at bedtime.   PATADAY 0.2 % Soln Generic drug:  Olopatadine HCl Apply 1 drop to eye daily as needed (FOR ALLERGIES).   PREDNISONE PO Take by mouth.   ranitidine 150 MG tablet Commonly known as:  ZANTAC TAKE 1 TABLET BY MOUTH TWICE A DAY   traMADol 50 MG tablet Commonly known as:  ULTRAM Take 1 tablet (50  mg total) by mouth every 6 (six) hours as needed.   ZETIA 10 MG tablet Generic drug:  ezetimibe Take 10 mg by mouth daily.       Known medication allergies: Allergies  Allergen Reactions  . Plum Pulp Itching and Swelling  . Prunus Persica Itching and Swelling  . Strawberry Extract Itching and Swelling  . Aspirin Other (See Comments)    Fever; Pt can take ibuprofen without problems.  . Fruit & Vegetable Daily [Nutritional Supplements] Other (See Comments)    ALL FRUIT WITH SEEDS- THROAT ITCHING  . Methocarbamol Nausea Only and Other (See Comments)    sleepy  . Morphine And Related Itching  . Percocet [Oxycodone-Acetaminophen] Itching and Nausea Only    Pt can take plain Tylenol and Tylenol with codeine.  . Shrimp [Shellfish Allergy] Itching    THROAT ITCHING  . Simvastatin Other (See Comments)    Body aches  . Methocarbamol   . Morphine   . Oxycodone-Acetaminophen   . Saccharin Nausea Only     Physical examination: Blood pressure (!) 160/94, pulse 90, resp. rate 12, SpO2 99 %.  General: Alert, interactive, in no acute distress. HEENT: PERRLA, TMs pearly [place], turbinates  minimally edematous without discharge, post-pharynx non erythematous. Neck: Supple without lymphadenopathy. Lungs: Clear to auscultation without wheezing, rhonchi or rales. {no increased work of breathing. CV: Normal S1, S2 without murmurs. Abdomen: Nondistended, nontender. Skin: Dry, mildly hyperpigmented, mildly thickened patches on the Across her forehead and on her dorsal surface of her hands bilaterally. Extremities:  No clubbing, cyanosis or edema. Neuro:   Grossly intact.  Diagnositics/Labs:  Spirometry: FEV1: 2.52L 124%, FVC: 3.11L 124%, ratio consistent with nonobstructive pattern  Assessment and plan:   Chronic urticaria    - improved     - continue Zyrtec 10 mg twice a day along.  Change Zantac to Pepcid 20mg  twice a day and continue Singulair daily   - Continue use of Hydrocortisone plus Eucerin compound to help with itch daily as well as eczema  Eczema  - continue use of Hydrocortisone plus Eucerin compound daily for moisturization  - continue Dupixent injections every 2 weeks.  I do note improvement in your skin overall since starting on Dupixent!  - continue moisturization 1-2 times a day with Hydrocortisone plus Eucerin compound   Allergic rhinitis  - continue avoidance measures for dust mites, cat, dog, grasses, cockroach, mold, trees, weeds  - Continue Zyrtec and Singulair as above  - Continue Flonase 2 sprays each nostril daily for nasal congestion.  Use for 1-2 weeks at a time  Asthma, mild intermittent  - currently with acute exacerbation  - continue Flovent 160mcg 2 puffs twice a day  - Continue use of as needed albuterol 2 puffs every 4-6 hours as needed for cough, wheeze, shortness of breath or chest tightness. Monitor frequency of use.  Will refill your albuterol today  - Singulair as above  Asthma control goals:   Full participation in all desired activities (may need albuterol before activity)  Albuterol use two time or less a week on average (not  counting use with activity)  Cough interfering with sleep two time or less a month  Oral steroids no more than once a year  No hospitalizations  Pollen food allergy syndrome   - Continue avoidance of fresh pitted fruits as well as strawberries and bananas and kiwis    - most are able to tolerate these foods in cooked forms  Elevated Blood Pressure   -  at this time asymptomatic.  We have discussed with her PCP regarding elevated blood pressures today.  Continue to monitor BP at home.  Please discuss this with your PCP.    Follow-up 4-6 months or sooner if needed  Total of 60 minutes, greater than 50% of which was spent in discussion of treatment and management options.    I appreciate the opportunity to take part in Synda's care. Please do not hesitate to contact me with questions.  Sincerely,   Prudy Feeler, MD Allergy/Immunology [facility]

## 2018-07-24 NOTE — Patient Instructions (Addendum)
Hives     - improved     - continue Zyrtec 10 mg twice a day along.  Change Zantac to Pepcid 20mg  twice a day and continue Singulair daily   - Continue use of Hydrocortisone plus Eucerin compound to help with itch daily as well as eczema  Eczema  - continue use of Hydrocortisone plus Eucerin compound daily for moisturization  - continue Dupixent injections every 2 weeks.  I do note improvement in your skin overall since starting on Dupixent!  - continue moisturization 1-2 times a day with Hydrocortisone plus Eucerin compound   Allergies  - continue avoidance measures for dust mites, cat, dog, grasses, cockroach, mold, trees, weeds  - Continue Zyrtec and Singulair as above  - Continue Flonase 2 sprays each nostril daily for nasal congestion.  Use for 1-2 weeks at a time  Asthma  - currently with acute exacerbation  - continue Flovent 1106mcg 2 puffs twice a day  - Continue use of as needed albuterol 2 puffs every 4-6 hours as needed for cough, wheeze, shortness of breath or chest tightness. Monitor frequency of use.  Will refill your albuterol today  - Singulair as above  Asthma control goals:   Full participation in all desired activities (may need albuterol before activity)  Albuterol use two time or less a week on average (not counting use with activity)  Cough interfering with sleep two time or less a month  Oral steroids no more than once a year  No hospitalizations  Pollen food allergy syndrome   - Continue avoidance of fresh pitted fruits as well as strawberries and bananas and kiwis    - most are able to tolerate these foods in cooked forms  Elevated Blood Pressure   - at this time asymptomatic.  We have discussed with her PCP regarding elevated blood pressures today.  Continue to monitor BP at home.  Please discuss this with your PCP.    Follow-up 4-6 months or sooner if needed

## 2018-07-25 ENCOUNTER — Telehealth: Payer: Self-pay | Admitting: *Deleted

## 2018-07-25 DIAGNOSIS — J4520 Mild intermittent asthma, uncomplicated: Secondary | ICD-10-CM

## 2018-07-25 MED ORDER — FLUTICASONE PROPIONATE HFA 110 MCG/ACT IN AERO: 2.0000 | INHALATION_SPRAY | Freq: Two times a day (BID) | RESPIRATORY_TRACT | 5 refills | Status: DC

## 2018-07-25 MED ORDER — ALBUTEROL SULFATE HFA 108 (90 BASE) MCG/ACT IN AERS: INHALATION_SPRAY | RESPIRATORY_TRACT | 1 refills | Status: DC

## 2018-07-25 NOTE — Telephone Encounter (Signed)
Patient called stating the pharmacy doesn't have her new rx, patient states Dr Nelva Bush is increasing her dosage on her inhaler. Please advise

## 2018-07-25 NOTE — Telephone Encounter (Signed)
Left message letting her know that sent scripts to the pharmacy.

## 2018-07-29 ENCOUNTER — Ambulatory Visit (INDEPENDENT_AMBULATORY_CARE_PROVIDER_SITE_OTHER): Payer: Medicaid Other | Admitting: *Deleted

## 2018-07-29 DIAGNOSIS — L209 Atopic dermatitis, unspecified: Secondary | ICD-10-CM | POA: Diagnosis not present

## 2018-07-29 DIAGNOSIS — L2089 Other atopic dermatitis: Secondary | ICD-10-CM

## 2018-08-08 ENCOUNTER — Telehealth: Payer: Self-pay | Admitting: Allergy

## 2018-08-08 ENCOUNTER — Other Ambulatory Visit: Payer: Self-pay | Admitting: *Deleted

## 2018-08-08 NOTE — Telephone Encounter (Signed)
Pt called and said that dr Nelva Bush was going to up her dose of flovent and it has not been done. She called the pharmacy and they tell her it is the same . [facility] [address] [phone].

## 2018-08-08 NOTE — Telephone Encounter (Signed)
Called and left a voicemail on both phone numbers for the patient asking to call back to discuss. There is no statement of changing the Flovent 110 for a higher dose. Please advise if a change needs to be made to a higher dose Flovent inhaler.

## 2018-08-08 NOTE — Telephone Encounter (Signed)
When the patient calls back it needs to be advised that they need to continue Flovent 110 and that Dr. Nelva Bush does not advise a change in medication at this time.

## 2018-08-08 NOTE — Telephone Encounter (Signed)
Yes I do not think we need to increase the flovent at this time.

## 2018-08-11 NOTE — Telephone Encounter (Signed)
Left detailed message on patients vm (okay per DPR) advising that at this time Dr. Nelva Bush is recommending to continue Flovent 110 two puffs twice a day.

## 2018-08-11 NOTE — Telephone Encounter (Signed)
Dr. Nelva Bush, wanted to confirm one more time that we are not supposed to increase strength of Flovent.   Thank you!

## 2018-08-11 NOTE — Telephone Encounter (Signed)
Patient called back and I gave her the message about how much she is to take. The patient states that at the visit Dr Nelva Bush told her due to her chest pains she was going to increase the strength of the Flovent.   Please Advise.

## 2018-08-12 ENCOUNTER — Ambulatory Visit (INDEPENDENT_AMBULATORY_CARE_PROVIDER_SITE_OTHER): Payer: Medicaid Other | Admitting: *Deleted

## 2018-08-12 DIAGNOSIS — L2089 Other atopic dermatitis: Secondary | ICD-10-CM

## 2018-08-12 MED ORDER — FLUTICASONE PROPIONATE HFA 220 MCG/ACT IN AERO: 2.0000 | INHALATION_SPRAY | Freq: Two times a day (BID) | RESPIRATORY_TRACT | 3 refills | Status: DC

## 2018-08-12 NOTE — Telephone Encounter (Signed)
Spoke to Dr Nelva Bush states it is ok we give Dupixent today and sent in [place] 220 patient advised

## 2018-08-12 NOTE — Telephone Encounter (Signed)
Can you clarify if the chest pain is chest tightness? Or Shortness of breath?   I do not recall discussing chest pain at last visit.   If she is having chest tightness requiring extra use of albuterol then we can increase to Flovent 269mcg 2 puffs twice a day.

## 2018-08-12 NOTE — Telephone Encounter (Signed)
lmom on machine for patient to call back

## 2018-08-12 NOTE — Telephone Encounter (Signed)
Spoke to patient wanted to come in to get [name] today. Advised she cannot come in if she is sick states she has shortness of breathe and chest pain everyday and this has been going on for a long time. Advised patient we would reach out to Dr Nelva Bush before we give Dupixent. Dr Nelva Bush please advise

## 2018-08-12 NOTE — Addendum Note (Signed)
Addended by: Orlene Erm on: 08/12/2018 12:57 PM   Modules accepted: Orders

## 2018-08-26 ENCOUNTER — Ambulatory Visit (INDEPENDENT_AMBULATORY_CARE_PROVIDER_SITE_OTHER): Payer: Medicaid Other | Admitting: *Deleted

## 2018-08-26 DIAGNOSIS — L209 Atopic dermatitis, unspecified: Secondary | ICD-10-CM

## 2018-08-26 DIAGNOSIS — L2089 Other atopic dermatitis: Secondary | ICD-10-CM

## 2018-09-09 ENCOUNTER — Ambulatory Visit (INDEPENDENT_AMBULATORY_CARE_PROVIDER_SITE_OTHER): Payer: Medicaid Other | Admitting: *Deleted

## 2018-09-09 DIAGNOSIS — L209 Atopic dermatitis, unspecified: Secondary | ICD-10-CM | POA: Diagnosis not present

## 2018-09-17 ENCOUNTER — Other Ambulatory Visit: Payer: Self-pay | Admitting: Allergy

## 2018-09-17 DIAGNOSIS — L508 Other urticaria: Secondary | ICD-10-CM

## 2018-09-23 ENCOUNTER — Ambulatory Visit (INDEPENDENT_AMBULATORY_CARE_PROVIDER_SITE_OTHER): Payer: Medicaid Other | Admitting: *Deleted

## 2018-09-23 DIAGNOSIS — L2089 Other atopic dermatitis: Secondary | ICD-10-CM | POA: Diagnosis not present

## 2018-10-07 ENCOUNTER — Ambulatory Visit (INDEPENDENT_AMBULATORY_CARE_PROVIDER_SITE_OTHER): Payer: Medicaid Other | Admitting: *Deleted

## 2018-10-07 DIAGNOSIS — L209 Atopic dermatitis, unspecified: Secondary | ICD-10-CM | POA: Diagnosis not present

## 2018-10-07 DIAGNOSIS — L2089 Other atopic dermatitis: Secondary | ICD-10-CM

## 2018-10-21 ENCOUNTER — Ambulatory Visit (INDEPENDENT_AMBULATORY_CARE_PROVIDER_SITE_OTHER): Payer: Medicaid Other | Admitting: *Deleted

## 2018-10-21 DIAGNOSIS — L2089 Other atopic dermatitis: Secondary | ICD-10-CM | POA: Diagnosis not present

## 2018-11-04 ENCOUNTER — Ambulatory Visit (INDEPENDENT_AMBULATORY_CARE_PROVIDER_SITE_OTHER): Payer: Medicaid Other | Admitting: *Deleted

## 2018-11-04 DIAGNOSIS — L209 Atopic dermatitis, unspecified: Secondary | ICD-10-CM | POA: Diagnosis not present

## 2018-11-18 ENCOUNTER — Ambulatory Visit (INDEPENDENT_AMBULATORY_CARE_PROVIDER_SITE_OTHER): Payer: Medicaid Other | Admitting: *Deleted

## 2018-11-18 ENCOUNTER — Other Ambulatory Visit: Payer: Self-pay

## 2018-11-18 DIAGNOSIS — L209 Atopic dermatitis, unspecified: Secondary | ICD-10-CM

## 2018-12-02 ENCOUNTER — Ambulatory Visit (INDEPENDENT_AMBULATORY_CARE_PROVIDER_SITE_OTHER): Payer: Medicaid Other | Admitting: *Deleted

## 2018-12-02 ENCOUNTER — Other Ambulatory Visit: Payer: Self-pay

## 2018-12-02 DIAGNOSIS — L209 Atopic dermatitis, unspecified: Secondary | ICD-10-CM | POA: Diagnosis not present

## 2018-12-16 ENCOUNTER — Ambulatory Visit (INDEPENDENT_AMBULATORY_CARE_PROVIDER_SITE_OTHER): Payer: Medicaid Other | Admitting: *Deleted

## 2018-12-16 DIAGNOSIS — L209 Atopic dermatitis, unspecified: Secondary | ICD-10-CM | POA: Diagnosis not present

## 2018-12-30 ENCOUNTER — Ambulatory Visit (INDEPENDENT_AMBULATORY_CARE_PROVIDER_SITE_OTHER): Payer: Medicaid Other

## 2018-12-30 DIAGNOSIS — L209 Atopic dermatitis, unspecified: Secondary | ICD-10-CM

## 2019-01-05 ENCOUNTER — Other Ambulatory Visit: Payer: Self-pay | Admitting: Allergy

## 2019-01-13 ENCOUNTER — Other Ambulatory Visit: Payer: Self-pay

## 2019-01-13 ENCOUNTER — Ambulatory Visit (INDEPENDENT_AMBULATORY_CARE_PROVIDER_SITE_OTHER): Payer: Medicaid Other | Admitting: *Deleted

## 2019-01-13 DIAGNOSIS — L209 Atopic dermatitis, unspecified: Secondary | ICD-10-CM | POA: Diagnosis not present

## 2019-01-14 ENCOUNTER — Other Ambulatory Visit: Payer: Self-pay | Admitting: *Deleted

## 2019-01-14 MED ORDER — FLUTICASONE PROPIONATE HFA 110 MCG/ACT IN AERO: 2.0000 | INHALATION_SPRAY | Freq: Two times a day (BID) | RESPIRATORY_TRACT | 0 refills | Status: DC

## 2019-01-15 ENCOUNTER — Other Ambulatory Visit: Payer: Self-pay | Admitting: *Deleted

## 2019-01-15 NOTE — Telephone Encounter (Signed)
[facility] flovent [facility]- pt is on flovent 110- rx sent yesterday.

## 2019-01-28 ENCOUNTER — Other Ambulatory Visit: Payer: Self-pay | Admitting: Allergy

## 2019-01-28 ENCOUNTER — Encounter: Payer: Self-pay | Admitting: Allergy

## 2019-01-28 ENCOUNTER — Ambulatory Visit (INDEPENDENT_AMBULATORY_CARE_PROVIDER_SITE_OTHER): Payer: Medicaid Other | Admitting: Allergy

## 2019-01-28 ENCOUNTER — Other Ambulatory Visit: Payer: Self-pay

## 2019-01-28 VITALS — BP 128/92 | HR 103 | Temp 98.2°F | Resp 16 | Ht 60.0 in | Wt 137.0 lb

## 2019-01-28 DIAGNOSIS — J3089 Other allergic rhinitis: Secondary | ICD-10-CM

## 2019-01-28 DIAGNOSIS — L2089 Other atopic dermatitis: Secondary | ICD-10-CM | POA: Diagnosis not present

## 2019-01-28 DIAGNOSIS — L508 Other urticaria: Secondary | ICD-10-CM | POA: Diagnosis not present

## 2019-01-28 DIAGNOSIS — L209 Atopic dermatitis, unspecified: Secondary | ICD-10-CM

## 2019-01-28 DIAGNOSIS — J4520 Mild intermittent asthma, uncomplicated: Secondary | ICD-10-CM | POA: Diagnosis not present

## 2019-01-28 DIAGNOSIS — T781XXD Other adverse food reactions, not elsewhere classified, subsequent encounter: Secondary | ICD-10-CM

## 2019-01-28 MED ORDER — FLUTICASONE PROPIONATE HFA 220 MCG/ACT IN AERO: 2.0000 | INHALATION_SPRAY | Freq: Every day | RESPIRATORY_TRACT | 5 refills | Status: DC

## 2019-01-28 MED ORDER — AZELASTINE HCL 0.1 % NA SOLN: 2.0000 | Freq: Two times a day (BID) | NASAL | 5 refills | Status: DC

## 2019-01-28 NOTE — Patient Instructions (Addendum)
Hives     - improved     - continue Zyrtec 10 mg twice a day along with Pepcid 20mg  twice a day.   -  continue Singulair daily   - Continue use of Hydrocortisone plus Eucerin compound to help with itch daily as well as eczema  Eczema  - skin is much improved!  - continue use of Hydrocortisone plus Eucerin compound daily for moisturization  - continue Dupixent injections every 2 weeks.  I do note improvement in your skin overall since starting on Dupixent!  - continue moisturization 1-2 times a day with Hydrocortisone plus Eucerin compound   Allergies  - continue avoidance measures for dust mites, cat, dog, grasses, cockroach, mold, trees, weeds  - Continue Zyrtec and Singulair as above  - Continue Flonase 2 sprays each nostril daily for nasal congestion.  Use for 1-2 weeks at a time  - Start Astelin 2 sprays each nostril 1-2 times a day for nasal drainage  Asthma  - improved  - continue Flovent 263mcg 2 puffs twice a day  - Continue use of as needed albuterol 2 puffs every 4-6 hours as needed for cough, wheeze, shortness of breath or chest tightness. Monitor frequency of use.  Will refill your albuterol today  - Singulair as above  Asthma control goals:   Full participation in all desired activities (may need albuterol before activity)  Albuterol use two time or less a week on average (not counting use with activity)  Cough interfering with sleep two time or less a month  Oral steroids no more than once a year  No hospitalizations  Pollen food allergy syndrome   - Continue avoidance of fresh pitted fruits as well as strawberries and bananas and kiwis    - most are able to tolerate these foods in cooked forms  Follow-up 4-6 months or sooner if needed

## 2019-01-28 NOTE — Progress Notes (Signed)
Follow-up Note  RE: [name] MRN: [id number] DOB: [birth year] Date of Office Visit: 01/28/2019   History of present illness: [name] is a 48 y.o. female presenting today for follow-up of atopic dermatitis, urticaria, allergic rhinitis with PFAS and asthma.  She was last seen in the office for visit on 07/24/18 by myself.   She states she has been doing well since last visit.   She continues on dupixent injections every 2 weeks that she gets in the office.  She states that her skin is improved and her darker areas are lightening up.  She does state she has mild itching but the hydroxyzine does help at night.   She states her asthma is doing better and she has not had any flares requiring systemic steroids or ED/UC visits.  She states the increased flovent 269mcg 2 puffs twice a day is working much better for symptom control than the medium dose.  Denies nighttime awakenings.  She does report constant runny nose.  She takes zyrtec twice a day with pepcid twice a day as well as singulair.  She also uses flonase for nasal congestion.   She continues to avoid pitted fruits, strawberries and kiwi.    Review of systems: Review of Systems  Constitutional: Negative for chills, fever and malaise/fatigue.  HENT: Positive for congestion. Negative for ear discharge, ear pain, nosebleeds, sinus pain and sore throat.   Eyes: Negative for pain, discharge and redness.  Respiratory: Negative for cough, shortness of breath and wheezing.   Cardiovascular: Negative for chest pain.  Gastrointestinal: Negative for abdominal pain, constipation, diarrhea, heartburn, nausea and vomiting.  Musculoskeletal: Negative for joint pain.  Skin: Positive for itching.  Neurological: Negative for headaches.    All other systems negative unless noted above in HPI  Past medical/social/surgical/family history have been reviewed and are unchanged unless specifically indicated below.  No changes  Medication  List: Allergies as of 01/28/2019      Reactions   Plum Pulp Itching, Swelling   Prunus Persica Itching, Swelling   Strawberry Extract Itching, Swelling   Aspirin Other (See Comments)   Fever; Pt can take ibuprofen without problems.   Fruit & Vegetable Daily [nutritional Supplements] Other (See Comments)   ALL FRUIT WITH SEEDS- THROAT ITCHING   Methocarbamol Nausea Only, Other (See Comments)   sleepy   Morphine And Related Itching   Percocet [oxycodone-acetaminophen] Itching, Nausea Only   Pt can take plain Tylenol and Tylenol with codeine.   Shrimp [shellfish Allergy] Itching   THROAT ITCHING   Simvastatin Other (See Comments)   Body aches   Methocarbamol    Morphine    Oxycodone-acetaminophen    Saccharin Nausea Only      Medication List       Accurate as of January 28, 2019  4:39 PM. If you have any questions, ask your nurse or doctor.        STOP taking these medications   acetaminophen-codeine 300-30 MG tablet Commonly known as:  TYLENOL #3 Stopped by:  Kerin Cecchi Charmian Muff, MD   fluticasone 110 MCG/ACT inhaler Commonly known as:  Flovent HFA Replaced by:  fluticasone 220 MCG/ACT inhaler Stopped by:  Gordie Crumby Charmian Muff, MD   furosemide 20 MG tablet Commonly known as:  LASIX Stopped by:  Joseandres Mazer Charmian Muff, MD   HYDROcodone-acetaminophen 5-325 MG tablet Commonly known as:  Norco Stopped by:  Khamiya Varin Charmian Muff, MD     TAKE these medications   albuterol 108 (90  Base) MCG/ACT inhaler Commonly known as:  ProAir HFA 2 puffs by mouth every 4-6 hours as needed.   azelastine 0.1 % nasal spray Commonly known as:  ASTELIN Place 2 sprays into both nostrils 2 (two) times daily. Use in each nostril as directed Started by:  Hema Lanza Charmian Muff, MD   cetirizine 10 MG tablet Commonly known as:  ZYRTEC TAKE 1 TABLET BY MOUTH TWICE A DAY   cyclobenzaprine 10 MG tablet Commonly known as:  FLEXERIL Take 10 mg by mouth at bedtime as needed.    diclofenac 75 MG EC tablet Commonly known as:  VOLTAREN TAKE 1 TABLET BY MOUTH TWICE A DAY AFTER MEALS FOR INFLAMMATION/PAIN/SWELLING AS NEEDED   DUPILUMAB (ASTHMA)  Inject into the skin.   EPINEPHrine 0.3 mg/0.3 mL Soaj injection Commonly known as:  EPI-PEN Use as directed, if administer call 911   famotidine 20 MG tablet Commonly known as:  Pepcid Take 1 tablet (20 mg total) by mouth 2 (two) times daily.   fluticasone 220 MCG/ACT inhaler Commonly known as:  Flovent HFA Inhale 2 puffs into the lungs daily. Replaces:  fluticasone 110 MCG/ACT inhaler Started by:  Mirah Nevins Charmian Muff, MD   fluticasone 50 MCG/ACT nasal spray Commonly known as:  FLONASE Place 1 spray into the nose daily as needed. For allergies   hydrocortisone 2.5 % lotion Mix 1:1 ratio with Eucerin 257ml. Apply 2 times daily.   hydrOXYzine 10 MG tablet Commonly known as:  ATARAX/VISTARIL TAKE 2 TABLETS BY MOUTH AT BEDTIME AS NEEDED FOR ITCH   Klor-Con M20 20 MEQ tablet Generic drug:  potassium chloride SA TAKE 1 TABLET BY MOUTH TWICE A DAY   labetalol 200 MG tablet Commonly known as:  NORMODYNE Take 200 mg by mouth 2 (two) times daily.   lidocaine 2 % solution Commonly known as:  XYLOCAINE SWISH AND SPIT WITH 10 ML'S EVERY 4 HOURS AS NEEDED FOR THROAT PAIN   lisinopril-hydrochlorothiazide 10-12.5 MG tablet Commonly known as:  ZESTORETIC Take 1 tablet by mouth daily.   meloxicam 7.5 MG tablet Commonly known as:  MOBIC meloxicam 7.5 mg tablet  TAKE 1 TAB BY MOUTH DAILY WITH FOOD   montelukast 10 MG tablet Commonly known as:  SINGULAIR Take 1 tablet (10 mg total) by mouth at bedtime.   mupirocin ointment 2 % Commonly known as:  BACTROBAN Apply 1 application topically as needed (for wound).   naproxen 500 MG tablet Commonly known as:  NAPROSYN Take 1 tablet (500 mg total) by mouth 2 (two) times daily as needed for mild pain, moderate pain or headache (TAKE WITH MEALS.).    nystatin-triamcinolone cream Commonly known as:  MYCOLOG II Apply to affected area (behind both ears) TID x 10 days   ondansetron 4 MG disintegrating tablet Commonly known as:  ZOFRAN-ODT One tablet every eight hours as needed   pantoprazole 40 MG tablet Commonly known as:  PROTONIX Take 40 mg by mouth at bedtime.   Pataday 0.2 % Soln Generic drug:  Olopatadine HCl Apply 1 drop to eye daily as needed (FOR ALLERGIES).   PREDNISONE PO Take by mouth.   ranitidine 150 MG tablet Commonly known as:  ZANTAC TAKE 1 TABLET BY MOUTH TWICE A DAY   traMADol 50 MG tablet Commonly known as:  ULTRAM Take 1 tablet (50 mg total) by mouth every 6 (six) hours as needed.   Zetia 10 MG tablet Generic drug:  ezetimibe Take 10 mg by mouth daily.       Known  medication allergies: Allergies  Allergen Reactions  . Plum Pulp Itching and Swelling  . Prunus Persica Itching and Swelling  . Strawberry Extract Itching and Swelling  . Aspirin Other (See Comments)    Fever; Pt can take ibuprofen without problems.  . Fruit & Vegetable Daily [Nutritional Supplements] Other (See Comments)    ALL FRUIT WITH SEEDS- THROAT ITCHING  . Methocarbamol Nausea Only and Other (See Comments)    sleepy  . Morphine And Related Itching  . Percocet [Oxycodone-Acetaminophen] Itching and Nausea Only    Pt can take plain Tylenol and Tylenol with codeine.  . Shrimp [Shellfish Allergy] Itching    THROAT ITCHING  . Simvastatin Other (See Comments)    Body aches  . Methocarbamol   . Morphine   . Oxycodone-Acetaminophen   . Saccharin Nausea Only     Physical examination: Blood pressure (!) 128/92, pulse (!) 103, temperature 98.2 F (36.8 C), resp. rate 16, height 5' (1.524 m), weight 137 lb (62.1 kg), SpO2 97 %.  General: Alert, interactive, in no acute distress. HEENT: PERRLA, TMs pearly [place], turbinates mildly edematous with clear discharge, post-pharynx non erythematous. Neck: Supple without  lymphadenopathy. Lungs: Clear to auscultation without wheezing, rhonchi or rales. {no increased work of breathing. CV: Normal S1, S2 without murmurs. Abdomen: Nondistended, nontender. Skin: forehead hyperpigmentation is improved and much lighter. Extremities:  No clubbing, cyanosis or edema. Neuro:   Grossly intact.  Diagnositics/Labs: None today  Assessment and plan: Patient Instructions  Hives     - improved     - continue Zyrtec 10 mg twice a day along with Pepcid 20mg  twice a day.   -  continue Singulair daily   - Continue use of Hydrocortisone plus Eucerin compound to help with itch daily as well as eczema  Eczema  - skin is much improved!  - continue use of Hydrocortisone plus Eucerin compound daily for moisturization  - continue Dupixent injections every 2 weeks.  I do note improvement in your skin overall since starting on Dupixent!  - continue moisturization 1-2 times a day with Hydrocortisone plus Eucerin compound   Allergies  - continue avoidance measures for dust mites, cat, dog, grasses, cockroach, mold, trees, weeds  - Continue Zyrtec and Singulair as above  - Continue Flonase 2 sprays each nostril daily for nasal congestion.  Use for 1-2 weeks at a time  - Start Astelin 2 sprays each nostril 1-2 times a day for nasal drainage  Asthma  - improved  - continue Flovent 289mcg 2 puffs twice a day  - Continue use of as needed albuterol 2 puffs every 4-6 hours as needed for cough, wheeze, shortness of breath or chest tightness. Monitor frequency of use.  Will refill your albuterol today  - Singulair as above  Asthma control goals:   Full participation in all desired activities (may need albuterol before activity)  Albuterol use two time or less a week on average (not counting use with activity)  Cough interfering with sleep two time or less a month  Oral steroids no more than once a year  No hospitalizations  Pollen food allergy syndrome   - Continue  avoidance of fresh pitted fruits as well as strawberries and bananas and kiwis    - most are able to tolerate these foods in cooked forms  Follow-up 4-6 months or sooner if needed  I appreciate the opportunity to take part in Kaidance's care. Please do not hesitate to contact me with questions.  Sincerely,  Prudy Feeler, MD Allergy/Immunology [facility]

## 2019-02-10 ENCOUNTER — Ambulatory Visit (INDEPENDENT_AMBULATORY_CARE_PROVIDER_SITE_OTHER): Payer: Medicaid Other | Admitting: *Deleted

## 2019-02-10 ENCOUNTER — Other Ambulatory Visit: Payer: Self-pay

## 2019-02-10 DIAGNOSIS — L209 Atopic dermatitis, unspecified: Secondary | ICD-10-CM

## 2019-02-24 ENCOUNTER — Ambulatory Visit (INDEPENDENT_AMBULATORY_CARE_PROVIDER_SITE_OTHER): Payer: Medicaid Other | Admitting: *Deleted

## 2019-02-24 ENCOUNTER — Other Ambulatory Visit: Payer: Self-pay

## 2019-02-24 DIAGNOSIS — L209 Atopic dermatitis, unspecified: Secondary | ICD-10-CM | POA: Diagnosis not present

## 2019-03-03 ENCOUNTER — Other Ambulatory Visit: Payer: Self-pay | Admitting: Allergy and Immunology

## 2019-03-10 ENCOUNTER — Ambulatory Visit (INDEPENDENT_AMBULATORY_CARE_PROVIDER_SITE_OTHER): Payer: Medicaid Other | Admitting: *Deleted

## 2019-03-10 ENCOUNTER — Other Ambulatory Visit: Payer: Self-pay

## 2019-03-10 DIAGNOSIS — L209 Atopic dermatitis, unspecified: Secondary | ICD-10-CM

## 2019-03-24 ENCOUNTER — Ambulatory Visit (INDEPENDENT_AMBULATORY_CARE_PROVIDER_SITE_OTHER): Payer: Medicaid Other | Admitting: *Deleted

## 2019-03-24 DIAGNOSIS — L209 Atopic dermatitis, unspecified: Secondary | ICD-10-CM

## 2019-03-24 MED ORDER — DUPILUMAB 300 MG/2ML ~~LOC~~ SOSY
300.0000 mg | PREFILLED_SYRINGE | SUBCUTANEOUS | Status: AC
  Administered 2019-03-24 – 2021-03-15 (×34): 300 mg via SUBCUTANEOUS

## 2019-03-25 ENCOUNTER — Ambulatory Visit: Payer: Self-pay

## 2019-04-07 ENCOUNTER — Ambulatory Visit (INDEPENDENT_AMBULATORY_CARE_PROVIDER_SITE_OTHER): Payer: Medicaid Other | Admitting: *Deleted

## 2019-04-07 DIAGNOSIS — L209 Atopic dermatitis, unspecified: Secondary | ICD-10-CM | POA: Diagnosis not present

## 2019-04-21 ENCOUNTER — Ambulatory Visit (INDEPENDENT_AMBULATORY_CARE_PROVIDER_SITE_OTHER): Payer: Medicaid Other | Admitting: *Deleted

## 2019-04-21 ENCOUNTER — Other Ambulatory Visit: Payer: Self-pay

## 2019-04-21 DIAGNOSIS — L209 Atopic dermatitis, unspecified: Secondary | ICD-10-CM | POA: Diagnosis not present

## 2019-04-30 ENCOUNTER — Other Ambulatory Visit: Payer: Self-pay

## 2019-04-30 MED ORDER — HYDROXYZINE HCL 10 MG PO TABS: ORAL_TABLET | ORAL | 5 refills | Status: DC

## 2019-05-05 ENCOUNTER — Other Ambulatory Visit: Payer: Self-pay

## 2019-05-05 ENCOUNTER — Ambulatory Visit (INDEPENDENT_AMBULATORY_CARE_PROVIDER_SITE_OTHER): Payer: Medicaid Other | Admitting: *Deleted

## 2019-05-05 DIAGNOSIS — L209 Atopic dermatitis, unspecified: Secondary | ICD-10-CM | POA: Diagnosis not present

## 2019-05-18 ENCOUNTER — Other Ambulatory Visit: Payer: Self-pay

## 2019-05-18 DIAGNOSIS — L508 Other urticaria: Secondary | ICD-10-CM

## 2019-05-18 MED ORDER — CETIRIZINE HCL 10 MG PO TABS: 10.0000 mg | ORAL_TABLET | Freq: Two times a day (BID) | ORAL | 2 refills | Status: DC

## 2019-05-19 ENCOUNTER — Ambulatory Visit (INDEPENDENT_AMBULATORY_CARE_PROVIDER_SITE_OTHER): Payer: Medicaid Other | Admitting: *Deleted

## 2019-05-19 ENCOUNTER — Other Ambulatory Visit: Payer: Self-pay

## 2019-05-19 DIAGNOSIS — L209 Atopic dermatitis, unspecified: Secondary | ICD-10-CM | POA: Diagnosis not present

## 2019-06-02 ENCOUNTER — Ambulatory Visit (INDEPENDENT_AMBULATORY_CARE_PROVIDER_SITE_OTHER): Payer: Medicaid Other | Admitting: *Deleted

## 2019-06-02 ENCOUNTER — Other Ambulatory Visit: Payer: Self-pay

## 2019-06-02 DIAGNOSIS — L209 Atopic dermatitis, unspecified: Secondary | ICD-10-CM

## 2019-06-08 ENCOUNTER — Other Ambulatory Visit: Payer: Self-pay | Admitting: Allergy

## 2019-06-16 ENCOUNTER — Other Ambulatory Visit: Payer: Self-pay

## 2019-06-16 ENCOUNTER — Ambulatory Visit: Payer: Self-pay

## 2019-06-16 ENCOUNTER — Ambulatory Visit (INDEPENDENT_AMBULATORY_CARE_PROVIDER_SITE_OTHER): Payer: Medicaid Other

## 2019-06-16 DIAGNOSIS — L209 Atopic dermatitis, unspecified: Secondary | ICD-10-CM | POA: Diagnosis not present

## 2019-06-30 ENCOUNTER — Other Ambulatory Visit: Payer: Self-pay

## 2019-06-30 ENCOUNTER — Ambulatory Visit (INDEPENDENT_AMBULATORY_CARE_PROVIDER_SITE_OTHER): Payer: Medicaid Other | Admitting: *Deleted

## 2019-06-30 DIAGNOSIS — L209 Atopic dermatitis, unspecified: Secondary | ICD-10-CM

## 2019-07-14 ENCOUNTER — Other Ambulatory Visit: Payer: Self-pay

## 2019-07-14 ENCOUNTER — Ambulatory Visit (INDEPENDENT_AMBULATORY_CARE_PROVIDER_SITE_OTHER): Payer: Medicaid Other

## 2019-07-14 DIAGNOSIS — L209 Atopic dermatitis, unspecified: Secondary | ICD-10-CM

## 2019-07-28 ENCOUNTER — Ambulatory Visit (INDEPENDENT_AMBULATORY_CARE_PROVIDER_SITE_OTHER): Payer: Medicaid Other

## 2019-07-28 ENCOUNTER — Other Ambulatory Visit: Payer: Self-pay

## 2019-07-28 DIAGNOSIS — L209 Atopic dermatitis, unspecified: Secondary | ICD-10-CM

## 2019-08-12 ENCOUNTER — Ambulatory Visit (INDEPENDENT_AMBULATORY_CARE_PROVIDER_SITE_OTHER): Payer: Medicaid Other | Admitting: *Deleted

## 2019-08-12 ENCOUNTER — Other Ambulatory Visit: Payer: Self-pay

## 2019-08-12 DIAGNOSIS — L209 Atopic dermatitis, unspecified: Secondary | ICD-10-CM | POA: Diagnosis not present

## 2019-08-25 ENCOUNTER — Other Ambulatory Visit: Payer: Self-pay

## 2019-08-25 ENCOUNTER — Ambulatory Visit (INDEPENDENT_AMBULATORY_CARE_PROVIDER_SITE_OTHER): Payer: Medicaid Other

## 2019-08-25 DIAGNOSIS — L209 Atopic dermatitis, unspecified: Secondary | ICD-10-CM

## 2019-09-07 ENCOUNTER — Other Ambulatory Visit: Payer: Self-pay | Admitting: Allergy

## 2019-09-07 DIAGNOSIS — L508 Other urticaria: Secondary | ICD-10-CM

## 2019-09-10 ENCOUNTER — Other Ambulatory Visit: Payer: Self-pay

## 2019-09-10 ENCOUNTER — Ambulatory Visit (INDEPENDENT_AMBULATORY_CARE_PROVIDER_SITE_OTHER): Payer: Medicaid Other

## 2019-09-10 DIAGNOSIS — L209 Atopic dermatitis, unspecified: Secondary | ICD-10-CM

## 2019-09-29 ENCOUNTER — Other Ambulatory Visit: Payer: Self-pay

## 2019-09-29 ENCOUNTER — Ambulatory Visit (INDEPENDENT_AMBULATORY_CARE_PROVIDER_SITE_OTHER): Payer: Medicaid Other

## 2019-09-29 DIAGNOSIS — L209 Atopic dermatitis, unspecified: Secondary | ICD-10-CM | POA: Diagnosis not present

## 2019-10-13 ENCOUNTER — Ambulatory Visit (INDEPENDENT_AMBULATORY_CARE_PROVIDER_SITE_OTHER): Payer: Medicaid Other

## 2019-10-13 ENCOUNTER — Other Ambulatory Visit: Payer: Self-pay

## 2019-10-13 DIAGNOSIS — L209 Atopic dermatitis, unspecified: Secondary | ICD-10-CM

## 2019-10-27 ENCOUNTER — Other Ambulatory Visit: Payer: Self-pay

## 2019-10-27 ENCOUNTER — Ambulatory Visit (INDEPENDENT_AMBULATORY_CARE_PROVIDER_SITE_OTHER): Payer: Medicaid Other

## 2019-10-27 DIAGNOSIS — L209 Atopic dermatitis, unspecified: Secondary | ICD-10-CM

## 2019-11-10 ENCOUNTER — Ambulatory Visit (INDEPENDENT_AMBULATORY_CARE_PROVIDER_SITE_OTHER): Payer: Medicaid Other

## 2019-11-10 ENCOUNTER — Other Ambulatory Visit: Payer: Self-pay

## 2019-11-10 DIAGNOSIS — L209 Atopic dermatitis, unspecified: Secondary | ICD-10-CM | POA: Diagnosis not present

## 2019-11-19 ENCOUNTER — Telehealth: Payer: Self-pay | Admitting: *Deleted

## 2019-11-19 NOTE — Telephone Encounter (Signed)
Tried to contact patient to advise needs MD appt for Dupixent reapproval but mailbox full and unable to leave message

## 2019-11-24 ENCOUNTER — Ambulatory Visit (INDEPENDENT_AMBULATORY_CARE_PROVIDER_SITE_OTHER): Payer: Medicaid Other

## 2019-11-24 ENCOUNTER — Other Ambulatory Visit: Payer: Self-pay

## 2019-11-24 DIAGNOSIS — L209 Atopic dermatitis, unspecified: Secondary | ICD-10-CM | POA: Diagnosis not present

## 2019-12-08 ENCOUNTER — Ambulatory Visit (INDEPENDENT_AMBULATORY_CARE_PROVIDER_SITE_OTHER): Payer: Medicaid Other

## 2019-12-08 DIAGNOSIS — L209 Atopic dermatitis, unspecified: Secondary | ICD-10-CM | POA: Diagnosis not present

## 2019-12-22 ENCOUNTER — Ambulatory Visit: Payer: Self-pay

## 2019-12-24 ENCOUNTER — Encounter: Payer: Self-pay | Admitting: Allergy

## 2019-12-24 ENCOUNTER — Ambulatory Visit: Payer: Medicaid Other | Admitting: Allergy

## 2019-12-24 ENCOUNTER — Other Ambulatory Visit: Payer: Self-pay

## 2019-12-24 ENCOUNTER — Ambulatory Visit (INDEPENDENT_AMBULATORY_CARE_PROVIDER_SITE_OTHER): Payer: Medicaid Other

## 2019-12-24 VITALS — BP 164/100 | HR 69 | Temp 97.8°F | Resp 16 | Ht 60.0 in | Wt 143.2 lb

## 2019-12-24 DIAGNOSIS — J4520 Mild intermittent asthma, uncomplicated: Secondary | ICD-10-CM

## 2019-12-24 DIAGNOSIS — J3089 Other allergic rhinitis: Secondary | ICD-10-CM

## 2019-12-24 DIAGNOSIS — L2084 Intrinsic (allergic) eczema: Secondary | ICD-10-CM

## 2019-12-24 DIAGNOSIS — L508 Other urticaria: Secondary | ICD-10-CM

## 2019-12-24 DIAGNOSIS — T781XXD Other adverse food reactions, not elsewhere classified, subsequent encounter: Secondary | ICD-10-CM

## 2019-12-24 DIAGNOSIS — L209 Atopic dermatitis, unspecified: Secondary | ICD-10-CM | POA: Diagnosis not present

## 2019-12-24 MED ORDER — HYDROCORTISONE 2.5 % EX LOTN: TOPICAL_LOTION | CUTANEOUS | 5 refills | Status: DC

## 2019-12-24 MED ORDER — OLOPATADINE HCL 0.2 % OP SOLN: 1.0000 [drp] | Freq: Every day | OPHTHALMIC | 1 refills | Status: DC | PRN

## 2019-12-24 MED ORDER — AZELASTINE HCL 0.1 % NA SOLN: 2.0000 | Freq: Two times a day (BID) | NASAL | 5 refills | Status: DC

## 2019-12-24 MED ORDER — MONTELUKAST SODIUM 10 MG PO TABS: 10.0000 mg | ORAL_TABLET | Freq: Every day | ORAL | 1 refills | Status: DC

## 2019-12-24 MED ORDER — FLUTICASONE PROPIONATE 50 MCG/ACT NA SUSP: 2.0000 | Freq: Every day | NASAL | 1 refills | Status: DC | PRN

## 2019-12-24 MED ORDER — FAMOTIDINE 20 MG PO TABS: 20.0000 mg | ORAL_TABLET | Freq: Two times a day (BID) | ORAL | 5 refills | Status: DC

## 2019-12-24 MED ORDER — CETIRIZINE HCL 10 MG PO TABS: 10.0000 mg | ORAL_TABLET | Freq: Two times a day (BID) | ORAL | 1 refills | Status: DC

## 2019-12-24 MED ORDER — FLOVENT HFA 220 MCG/ACT IN AERO: 2.0000 | INHALATION_SPRAY | Freq: Two times a day (BID) | RESPIRATORY_TRACT | 1 refills | Status: DC

## 2019-12-24 NOTE — Patient Instructions (Addendum)
Hives     - improved     - continue Zyrtec 10 mg twice a day along with Pepcid 20mg  twice a day.    -  continue Singulair daily    - Continue use of Hydrocortisone plus Eucerin compound to help with itch daily as well as eczema  Eczema  - skin is much improved!  - continue use of Hydrocortisone plus Eucerin compound daily for moisturization  - continue Dupixent injections every 2 weeks.  I do note improvement in your skin overall since starting on Dupixent!   Allergies  - continue avoidance measures for dust mites, cat, dog, grasses, cockroach, mold, trees, weeds  - Continue Zyrtec and Singulair as above  - Continue Flonase 2 sprays each nostril daily for nasal congestion.  Use for 1-2 weeks at a time  - Continue Astelin 2 sprays each nostril 1-2 times a day for nasal drainage  - use Olopatadine 1 drop each eye daily as needed for itchy/watery/red eyes  Asthma  - lung function looks great today!  - continue Flovent 239mcg 2 puffs twice a day  - Continue use of as needed albuterol 2 puffs every 4-6 hours as needed for cough, wheeze, shortness of breath or chest tightness. Monitor frequency of use.    - Singulair as above  Asthma control goals:   Full participation in all desired activities (may need albuterol before activity)  Albuterol use two time or less a week on average (not counting use with activity)  Cough interfering with sleep two time or less a month  Oral steroids no more than once a year  No hospitalizations  Pollen food allergy syndrome   - Continue avoidance of fresh pitted fruits as well as strawberries and bananas and kiwis    - most are able to tolerate these foods in cooked forms  Follow-up 6 months or sooner if needed

## 2019-12-24 NOTE — Progress Notes (Signed)
Follow-up Note  RE: [name] MRN: [id number] DOB: 1970/11/06 Date of Office Visit: 12/24/2019   History of present illness: [name] is a 49 y.o. female presenting today for follow-up of asthma, atopic dermatitis, urticaria allergic rhinitis and food allergy.  She was last seen in the office on 01/28/2019 by myself.  She states she has been doing relatively well since her last visit.  She does report that a neighbor has started smoking marijuana and it is moving through the vents and impacting her breathing.  She states she has had more cough and wheeze and chest tightness and has needed to use her albuterol due to this.  She did buy an air purifier for her apartment.  She has complained to her apartment management along with other neighbors about this issue.  She otherwise has not had any ED or urgent care visits or any systemic steroid needs.  She is taking Flovent 220 mcg 2 puffs twice a day as well as her Singulair daily. She denies having any further hives.  She does continue on Zyrtec and Pepcid twice a day as well as the Singulair. She states her skin is doing better still.  She has occasional itching but nothing is nearly as bad as of prior to starting on Dupixent.  She is tolerating her Dupixent injections every 2 weeks.  She does use hydrocortisone compounded with Eucerin for moisturization. She states of tree pollen season she has had more itchy and red eyes, runny nose leading to cough.  She is using Flonase and Astelin.  Review of systems: Review of Systems  Constitutional: Negative.   HENT:       See HPI  Eyes: Negative.   Respiratory:       See HPI  Cardiovascular: Negative.   Gastrointestinal: Negative.   Musculoskeletal: Negative.   Skin: Positive for itching.  Neurological: Negative.     All other systems negative unless noted above in HPI  Past medical/social/surgical/family history have been reviewed and are unchanged unless specifically indicated  below.  No changes  Medication List: Current Outpatient Medications  Medication Sig Dispense Refill  . albuterol (PROAIR HFA) 108 (90 Base) MCG/ACT inhaler 2 puffs by mouth every 4-6 hours as needed. 1 Inhaler 1  . azelastine (ASTELIN) 0.1 % nasal spray Place 2 sprays into both nostrils 2 (two) times daily. Use in each nostril as directed 30 mL 5  . cetirizine (ZYRTEC) 10 MG tablet TAKE 1 TABLET BY MOUTH TWICE A DAY 60 tablet 0  . cyclobenzaprine (FLEXERIL) 10 MG tablet Take 10 mg by mouth at bedtime as needed.  0  . diclofenac (VOLTAREN) 75 MG EC tablet TAKE 1 TABLET BY MOUTH TWICE A DAY AFTER MEALS FOR INFLAMMATION/PAIN/SWELLING AS NEEDED  1  . DUPILUMAB, ASTHMA, [place] Inject into the skin.    . DUPIXENT 300 MG/2ML prefilled syringe INJECT 300 MG UNDER THE SKIN EVERY 2 WEEKS 4 mL 12  . EPINEPHrine 0.3 mg/0.3 mL IJ SOAJ injection Use as directed, if administer call 911    . famotidine (PEPCID) 20 MG tablet Take 1 tablet (20 mg total) by mouth 2 (two) times daily. 180 tablet 5  . FLOVENT HFA 220 MCG/ACT inhaler INHALE 2 PUFFS BY MOUTH TWICE A DAY 1 Inhaler 1  . fluticasone (FLONASE) 50 MCG/ACT nasal spray Place 1 spray into the nose daily as needed. For allergies    . hydrocortisone 2.5 % lotion Mix 1:1 ratio with Eucerin 229ml. Apply 2 times  daily. 236 mL 5  . hydrOXYzine (ATARAX/VISTARIL) 10 MG tablet TAKE 2 TABLETS BY MOUTH AT BEDTIME AS NEEDED FOR ITCH 60 tablet 5  . labetalol (NORMODYNE) 200 MG tablet Take 200 mg by mouth 2 (two) times daily.    . meloxicam (MOBIC) 7.5 MG tablet meloxicam 7.5 mg tablet  TAKE 1 TAB BY MOUTH DAILY WITH FOOD    . montelukast (SINGULAIR) 10 MG tablet Take 1 tablet (10 mg total) by mouth at bedtime. 30 tablet 5  . mupirocin ointment (BACTROBAN) 2 % Apply 1 application topically as needed (for wound).     . naproxen (NAPROSYN) 500 MG tablet Take 1 tablet (500 mg total) by mouth 2 (two) times daily as needed for mild pain, moderate pain or headache (TAKE WITH  MEALS.). 20 tablet 0  . nystatin-triamcinolone (MYCOLOG II) cream Apply to affected area (behind both ears) TID x 10 days 15 g 0  . Olopatadine HCl (PATADAY) 0.2 % SOLN Apply 1 drop to eye daily as needed (FOR ALLERGIES).    Marland Kitchen ondansetron (ZOFRAN-ODT) 4 MG disintegrating tablet One tablet every eight hours as needed 20 tablet 0  . pantoprazole (PROTONIX) 40 MG tablet Take 40 mg by mouth at bedtime.    . potassium chloride SA (KLOR-CON M20) 20 MEQ tablet TAKE 1 TABLET BY MOUTH TWICE A DAY    . PREDNISONE PO Take by mouth.    . ranitidine (ZANTAC) 150 MG tablet TAKE 1 TABLET BY MOUTH TWICE A DAY 60 tablet 3  . traMADol (ULTRAM) 50 MG tablet Take 1 tablet (50 mg total) by mouth every 6 (six) hours as needed. 15 tablet 0  . ZETIA 10 MG tablet Take 10 mg by mouth daily.  1  . lidocaine (XYLOCAINE) 2 % solution SWISH AND SPIT WITH 10 ML'S EVERY 4 HOURS AS NEEDED FOR THROAT PAIN  0  . lisinopril-hydrochlorothiazide (PRINZIDE,ZESTORETIC) 10-12.5 MG tablet Take 1 tablet by mouth daily.  0   Current Facility-Administered Medications  Medication Dose Route Frequency Provider Last Rate Last Admin  . dupilumab (DUPIXENT) prefilled syringe 300 mg  300 mg Subcutaneous Q14 Days Jiles Prows, MD   300 mg at 12/24/19 1755     Known medication allergies: Allergies  Allergen Reactions  . Plum Pulp Itching and Swelling  . Prunus Persica Itching and Swelling  . Strawberry Extract Itching and Swelling  . Aspirin Other (See Comments)    Fever; Pt can take ibuprofen without problems.  . Fruit & Vegetable Daily [Nutritional Supplements] Other (See Comments)    ALL FRUIT WITH SEEDS- THROAT ITCHING  . Methocarbamol Nausea Only and Other (See Comments)    sleepy  . Morphine And Related Itching  . Percocet [Oxycodone-Acetaminophen] Itching and Nausea Only    Pt can take plain Tylenol and Tylenol with codeine.  . Shrimp [Shellfish Allergy] Itching    THROAT ITCHING  . Simvastatin Other (See Comments)    Body  aches  . Methocarbamol   . Morphine   . Oxycodone-Acetaminophen   . Saccharin Nausea Only     Physical examination: Blood pressure (!) 164/100, pulse 69, temperature 97.8 F (36.6 C), temperature source Temporal, resp. rate 16, height 5' (1.524 m), weight 143 lb 3.2 oz (65 kg), SpO2 99 %.  General: Alert, interactive, in no acute distress. HEENT: PERRLA, TMs pearly [place], turbinates minimally edematous with clear discharge, post-pharynx non erythematous. Neck: Supple without lymphadenopathy. Lungs: Clear to auscultation without wheezing, rhonchi or rales. {no increased work of breathing. CV:  Normal S1, S2 without murmurs. Abdomen: Nondistended, nontender. Skin: Warm and dry, without lesions or rashes. Extremities:  No clubbing, cyanosis or edema. Neuro:   Grossly intact.  Diagnositics/Labs:  Spirometry: FEV1: 2.68L 133%, FVC: 3.31L 133%, ratio consistent with nonobstructive pattern  Assessment and plan: Urticaria    - improved without any flares    - continue Zyrtec 10 mg twice a day along with Pepcid 20mg  twice a day.    -  continue Singulair daily    - Continue use of Hydrocortisone plus Eucerin compound to help with itch daily as well as eczema  Eczema  - skin is much improved!  - continue use of Hydrocortisone plus Eucerin compound daily for moisturization  - continue Dupixent injections every 2 weeks.  I do note improvement in your skin overall since starting on Dupixent!   Allergic rhinitis  - continue avoidance measures for dust mites, cat, dog, grasses, cockroach, mold, trees, weeds  - Continue Zyrtec and Singulair as above  - Continue Flonase 2 sprays each nostril daily for nasal congestion.  Use for 1-2 weeks at a time  - Continue Astelin 2 sprays each nostril 1-2 times a day for nasal drainage  - use Olopatadine 1 drop each eye daily as needed for itchy/watery/red eyes  Asthma, mild intermittent  - lung function looks great today!  - continue Flovent 262mcg 2  puffs twice a day  - Continue use of as needed albuterol 2 puffs every 4-6 hours as needed for cough, wheeze, shortness of breath or chest tightness. Monitor frequency of use.    - Singulair as above  Asthma control goals:   Full participation in all desired activities (may need albuterol before activity)  Albuterol use two time or less a week on average (not counting use with activity)  Cough interfering with sleep two time or less a month  Oral steroids no more than once a year  No hospitalizations  Pollen food allergy syndrome   - Continue avoidance of fresh pitted fruits as well as strawberries and bananas and kiwis    - most are able to tolerate these foods in cooked forms  Follow-up 6 months or sooner if needed  I appreciate the opportunity to take part in [name]'s care. Please do not hesitate to contact me with questions.  Sincerely,   Prudy Feeler, MD Allergy/Immunology [facility] [place]

## 2019-12-25 ENCOUNTER — Other Ambulatory Visit: Payer: Self-pay

## 2020-01-11 ENCOUNTER — Ambulatory Visit (INDEPENDENT_AMBULATORY_CARE_PROVIDER_SITE_OTHER): Payer: Medicaid Other

## 2020-01-11 ENCOUNTER — Ambulatory Visit: Payer: Self-pay

## 2020-01-11 ENCOUNTER — Other Ambulatory Visit: Payer: Self-pay

## 2020-01-11 DIAGNOSIS — L209 Atopic dermatitis, unspecified: Secondary | ICD-10-CM | POA: Diagnosis not present

## 2020-01-14 ENCOUNTER — Ambulatory Visit: Payer: Self-pay

## 2020-01-28 ENCOUNTER — Ambulatory Visit (INDEPENDENT_AMBULATORY_CARE_PROVIDER_SITE_OTHER): Payer: Medicaid Other

## 2020-01-28 ENCOUNTER — Other Ambulatory Visit: Payer: Self-pay

## 2020-01-28 DIAGNOSIS — L209 Atopic dermatitis, unspecified: Secondary | ICD-10-CM

## 2020-02-16 ENCOUNTER — Other Ambulatory Visit: Payer: Self-pay

## 2020-02-16 ENCOUNTER — Ambulatory Visit (INDEPENDENT_AMBULATORY_CARE_PROVIDER_SITE_OTHER): Payer: Medicaid Other

## 2020-02-16 DIAGNOSIS — L209 Atopic dermatitis, unspecified: Secondary | ICD-10-CM | POA: Diagnosis not present

## 2020-03-01 ENCOUNTER — Ambulatory Visit (INDEPENDENT_AMBULATORY_CARE_PROVIDER_SITE_OTHER): Payer: Medicaid Other

## 2020-03-01 ENCOUNTER — Other Ambulatory Visit: Payer: Self-pay

## 2020-03-01 DIAGNOSIS — L209 Atopic dermatitis, unspecified: Secondary | ICD-10-CM

## 2020-03-17 ENCOUNTER — Other Ambulatory Visit: Payer: Self-pay

## 2020-03-17 ENCOUNTER — Ambulatory Visit (INDEPENDENT_AMBULATORY_CARE_PROVIDER_SITE_OTHER): Payer: Medicaid Other

## 2020-03-17 DIAGNOSIS — L209 Atopic dermatitis, unspecified: Secondary | ICD-10-CM | POA: Diagnosis not present

## 2020-03-31 ENCOUNTER — Ambulatory Visit (INDEPENDENT_AMBULATORY_CARE_PROVIDER_SITE_OTHER): Payer: Medicaid Other

## 2020-03-31 ENCOUNTER — Other Ambulatory Visit: Payer: Self-pay

## 2020-03-31 DIAGNOSIS — L209 Atopic dermatitis, unspecified: Secondary | ICD-10-CM

## 2020-04-12 ENCOUNTER — Ambulatory Visit (INDEPENDENT_AMBULATORY_CARE_PROVIDER_SITE_OTHER): Payer: Medicaid Other

## 2020-04-12 ENCOUNTER — Other Ambulatory Visit: Payer: Self-pay

## 2020-04-12 DIAGNOSIS — L209 Atopic dermatitis, unspecified: Secondary | ICD-10-CM

## 2020-04-20 ENCOUNTER — Telehealth: Payer: Self-pay

## 2020-04-20 NOTE — Telephone Encounter (Signed)
She has been very good about coming in for her Dupixent and tolerating it well.  If she can show appropriate use of self-administered injection then I agree with her performing this at home.

## 2020-04-20 NOTE — Telephone Encounter (Signed)
Patient called to schedule her next Dupixent injection. Patient is wondering if she may be able to give herself her Dupixent injection at home. She stated that due to her not being able to get here until after 5/5:30 and there not being any available appointments at the times she is wanting she would like to give it to herself so she stays on track with every 2 weeks. Please advise.

## 2020-04-20 NOTE — Telephone Encounter (Signed)
Called and left a detailed message informing patient of Dr. Jeralyn Ruths recommendation. Informed patient via voicemail that if she had any questions or concerns to give the office a call.

## 2020-05-03 ENCOUNTER — Other Ambulatory Visit: Payer: Self-pay

## 2020-05-03 ENCOUNTER — Ambulatory Visit (INDEPENDENT_AMBULATORY_CARE_PROVIDER_SITE_OTHER): Payer: Medicaid Other | Admitting: *Deleted

## 2020-05-03 DIAGNOSIS — L209 Atopic dermatitis, unspecified: Secondary | ICD-10-CM

## 2020-05-18 ENCOUNTER — Other Ambulatory Visit: Payer: Self-pay | Admitting: Allergy

## 2020-05-18 ENCOUNTER — Other Ambulatory Visit: Payer: Self-pay

## 2020-05-18 MED ORDER — DUPIXENT 300 MG/2ML ~~LOC~~ SOSY: PREFILLED_SYRINGE | SUBCUTANEOUS | 5 refills | Status: DC

## 2020-05-18 NOTE — Telephone Encounter (Signed)
Please advise 

## 2020-05-26 ENCOUNTER — Other Ambulatory Visit: Payer: Self-pay

## 2020-05-26 ENCOUNTER — Ambulatory Visit (INDEPENDENT_AMBULATORY_CARE_PROVIDER_SITE_OTHER): Payer: Medicaid Other

## 2020-05-26 DIAGNOSIS — L209 Atopic dermatitis, unspecified: Secondary | ICD-10-CM | POA: Diagnosis not present

## 2020-06-09 ENCOUNTER — Ambulatory Visit (INDEPENDENT_AMBULATORY_CARE_PROVIDER_SITE_OTHER): Payer: Medicaid Other

## 2020-06-09 ENCOUNTER — Other Ambulatory Visit: Payer: Self-pay

## 2020-06-09 DIAGNOSIS — L209 Atopic dermatitis, unspecified: Secondary | ICD-10-CM | POA: Diagnosis not present

## 2020-06-28 ENCOUNTER — Other Ambulatory Visit: Payer: Self-pay

## 2020-06-28 ENCOUNTER — Ambulatory Visit (INDEPENDENT_AMBULATORY_CARE_PROVIDER_SITE_OTHER): Payer: Medicaid Other | Admitting: *Deleted

## 2020-06-28 DIAGNOSIS — L209 Atopic dermatitis, unspecified: Secondary | ICD-10-CM | POA: Diagnosis not present

## 2020-07-07 ENCOUNTER — Ambulatory Visit: Payer: Medicaid Other | Admitting: Allergy

## 2020-07-12 ENCOUNTER — Ambulatory Visit (INDEPENDENT_AMBULATORY_CARE_PROVIDER_SITE_OTHER): Payer: Medicaid Other

## 2020-07-12 ENCOUNTER — Other Ambulatory Visit: Payer: Self-pay

## 2020-07-12 DIAGNOSIS — L209 Atopic dermatitis, unspecified: Secondary | ICD-10-CM | POA: Diagnosis not present

## 2020-07-17 NOTE — Patient Instructions (Addendum)
Urticaria Continue Zyrtec 10 mg twice a day Continue Pepcid 20 mg twice a day Continue Singulair 10 mg daily  Eczema Continue Dupixent injections every 2 weeks Continue hydrocortisone plus Eucerin daily for moisturization  Allergic rhinitis(dust mite, cats, dogs, grass, cockroach, molds, trees, weeds) Start ipratropium bromide nasal spray 0.03%- using 2 sprays in each nostril 2-3 times a day as needed for drainage. Start saline nasal rinses to help with drainage. Use this prior to any medicated nasal sprays. Continue Zyrtec and Singulair as above Continue Flonase 2 sprays each nostril once a day as needed for stuffy nose Stop Astelin (azelastine) nasal spray Continue olopatadine 1 drop each eye once a day as needed for itchy watery eyes  Mild intermittent asthma Start Flovent 220 mcg 2 puffs twice a day with spacer to help prevent cough and wheeze. Do not use more than perscribed Continue albuterol 2 puffs every 4 hours as needed for cough, wheeze, tightness in chest, or shortness of breath. Continue Singulair as above  Pollen food allergy syndrome Continue avoidance of fresh pitted fruits as well as strawberries, bananas, and kiwi's  Food allergy Avoid pistachio and pistachio pudding. In case of an allergic reaction, give Benadryl 4  teaspoonfuls every 4 hours, and if life-threatening symptoms occur, inject with EpiPen 0.3 mg.  Chest pain Schedule an appointment with primary care physician to discuss.    Please let us know if this treatment plan is not working well for you. Schedule a follow-up appointment in 3 months

## 2020-07-18 ENCOUNTER — Ambulatory Visit (INDEPENDENT_AMBULATORY_CARE_PROVIDER_SITE_OTHER): Payer: Medicaid Other | Admitting: Family

## 2020-07-18 ENCOUNTER — Encounter: Payer: Self-pay | Admitting: Family

## 2020-07-18 ENCOUNTER — Other Ambulatory Visit: Payer: Self-pay

## 2020-07-18 VITALS — BP 126/90 | HR 82 | Temp 97.7°F | Resp 18 | Ht <= 58 in | Wt 143.4 lb

## 2020-07-18 DIAGNOSIS — L508 Other urticaria: Secondary | ICD-10-CM | POA: Diagnosis not present

## 2020-07-18 DIAGNOSIS — T7800XD Anaphylactic reaction due to unspecified food, subsequent encounter: Secondary | ICD-10-CM

## 2020-07-18 DIAGNOSIS — J3089 Other allergic rhinitis: Secondary | ICD-10-CM

## 2020-07-18 DIAGNOSIS — J4520 Mild intermittent asthma, uncomplicated: Secondary | ICD-10-CM

## 2020-07-18 DIAGNOSIS — L2084 Intrinsic (allergic) eczema: Secondary | ICD-10-CM | POA: Diagnosis not present

## 2020-07-18 DIAGNOSIS — T781XXD Other adverse food reactions, not elsewhere classified, subsequent encounter: Secondary | ICD-10-CM

## 2020-07-18 DIAGNOSIS — T7800XA Anaphylactic reaction due to unspecified food, initial encounter: Secondary | ICD-10-CM

## 2020-07-18 MED ORDER — IPRATROPIUM BROMIDE 0.03 % NA SOLN: NASAL | 3 refills | Status: DC

## 2020-07-18 MED ORDER — HYDROCORTISONE 2.5 % EX LOTN: TOPICAL_LOTION | CUTANEOUS | 5 refills | Status: DC

## 2020-07-18 MED ORDER — EPINEPHRINE 0.3 MG/0.3ML IJ SOAJ: INTRAMUSCULAR | 1 refills | Status: DC

## 2020-07-18 MED ORDER — FLOVENT HFA 220 MCG/ACT IN AERO: 2.0000 | INHALATION_SPRAY | Freq: Two times a day (BID) | RESPIRATORY_TRACT | 5 refills | Status: DC

## 2020-07-18 NOTE — Progress Notes (Signed)
[name] [id number] Dept: [phone]  FOLLOW UP NOTE  Patient ID: [name], female    DOB: [birth year]  Age: 49 y.o. MRN: [id number] Date of Office Visit: 07/18/2020  Assessment  Chief Complaint: Follow-up  HPI [name] is a 49 year old female who presents today for follow-up on chronic urticaria, atopic dermatitis, seasonal and perennial allergic rhinitis, mild intermittent asthma, and pollen food allergy syndrome.  She was last seen on Dec 24, 2019 by Dr. Nelva Bush.  Urticaria is reported as doing well with Zyrtec 10 mg twice a day, Protonix, and Singulair daily.  She reports that she occasionally takes hydroxyzine.  Atopic dermatitis is reported as doing good with hydrocortisone plus Eucerin and Dupixent injections every 2 weeks.  She reports breaking out around the mouth since wearing a mask, but hydrocortisone plus Eucerin has helped some. When asked if she has ever tried [place] she reports that she has tried everything and hydrocortisone works best.  Allergic rhinitis is reported as not well controlled with Singulair 10 mg once a day, Zyrtec 10 mg twice a day, Flonase as needed, Astelin as needed, and olopatadine eye drops as needed.  She reports that Astelin causes a bad after taste and she had a bad reaction to it.  She reports clear rhinorrhea, nasal congestion, postnasal drip and itchy watery eyes for which olopatadine eye drops help.  She is requesting Zofran for drainage.  Mild intermittent asthma is reported as moderately controlled with Flovent 220 mcg, Singulair 10 mg once a day, and albuterol as needed.  She is only using her Flovent 2-3 times a week, but when she does use it she is using it 2 puffs 3-4 times a day.  She reports a little bit of shortness of breath and denies any coughing, wheezing, tightness in her chest, and nocturnal awakenings.  Since her last office visit she has not required any systemic steroids or made any trips to the  emergency room or urgent care due to breathing problems.  She uses her albuterol inhaler once a week.  She reports off and on pain in her chest since the bar of a shopping cart hit her in the chest a couple of years ago.  She continues to avoid fresh pitted fruits, strawberries, bananas and kiwi's without any accidental ingestion.  She also reports that she has been deathly allergic to pistachio and pistachio pudding since a child and would like this added to her list of allergies.  She reports that she has an EpiPen on hand.   Drug Allergies:  Allergies  Allergen Reactions  . Plum Pulp Itching and Swelling  . Prunus Persica Itching and Swelling  . Strawberry Extract Itching and Swelling  . Aspirin Other (See Comments)    Fever; Pt can take ibuprofen without problems.  . Fruit & Vegetable Daily [Nutritional Supplements] Other (See Comments)    ALL FRUIT WITH SEEDS- THROAT ITCHING  . Methocarbamol Nausea Only and Other (See Comments)    sleepy  . Morphine And Related Itching  . Percocet [Oxycodone-Acetaminophen] Itching and Nausea Only    Pt can take plain Tylenol and Tylenol with codeine.  . Shrimp [Shellfish Allergy] Itching    THROAT ITCHING  . Simvastatin Other (See Comments)    Body aches  . Methocarbamol   . Morphine   . Oxycodone-Acetaminophen   . Pistachio Nut (Diagnostic) Other (See Comments)    As well as pistachio pudding  . Saccharin Nausea Only  Review of Systems: Review of Systems  Constitutional: Negative for chills and fever.  HENT:       Reports clear rhinorrhea, post nasal drip, and nasal congestion  Eyes:       Reports itchy watery eyes  Respiratory: Positive for shortness of breath. Negative for cough and wheezing.        Reports a little bit of shortness of breath  Cardiovascular: Positive for chest pain.       Reports off/on chest pain since getting hit by the bar of a  shopping cart awhile back  Gastrointestinal: Positive for abdominal pain and  heartburn.       Reports occasional heartburn and abdominal pain  Genitourinary: Negative for dysuria.  Skin: Positive for itching. Negative for rash.  Neurological: Negative for headaches.  Endo/Heme/Allergies: Positive for environmental allergies.     Physical Exam: BP 126/90 (BP Location: Left Leg, Patient Position: Sitting, Cuff Size: Normal)   Pulse 82   Temp 97.7 F (36.5 C) (Temporal)   Resp 18   Ht 4\' 10"  (1.473 m)   Wt 143 lb 6.4 oz (65 kg)   SpO2 100%   BMI 29.97 kg/m    Physical Exam Constitutional:      Appearance: Normal appearance.  HENT:     Head: Normocephalic and atraumatic.     Comments: Pharynx normal. Eyes normal. Ears: left ear normal. Unable to visualize right tympanic membrane due to cerumen. Nose: slightly erythematous and moderately edematous with clear drainage noted    Right Ear: Ear canal and external ear normal.     Left Ear: Tympanic membrane, ear canal and external ear normal.     Mouth/Throat:     Mouth: Mucous membranes are moist.     Pharynx: Oropharynx is clear.  Eyes:     Conjunctiva/sclera: Conjunctivae normal.  Cardiovascular:     Rate and Rhythm: Normal rate and regular rhythm.     Heart sounds: Normal heart sounds.  Pulmonary:     Effort: Pulmonary effort is normal.     Breath sounds: Normal breath sounds.     Comments: Lungs clear to auscultation Musculoskeletal:     Cervical back: Neck supple.  Skin:    General: Skin is warm.  Neurological:     Mental Status: She is alert and oriented to person, place, and time.  Psychiatric:        Mood and Affect: Mood normal.        Behavior: Behavior normal.        Thought Content: Thought content normal.        Judgment: Judgment normal.     Diagnostics: FVC 3.00 L, FEV1 2.27 L.  Predicted FVC 2.26 L, FEV1 1.83 L.  Spirometry indicates normal ventilatory function.  Assessment and Plan: 1. Mild intermittent asthma, uncomplicated   2. Intrinsic atopic dermatitis   3.  Non-seasonal allergic rhinitis due to other allergic trigger   4. Chronic urticaria   5. 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   Patient Instructions  Urticaria Continue Zyrtec 10 mg twice a day Continue Pepcid 20 mg twice a day Continue Singulair 10 mg daily  Eczema Continue Dupixent injections every 2 weeks Continue hydrocortisone plus Eucerin daily for moisturization  Allergic rhinitis(dust mite, cats, dogs, grass, cockroach, molds, trees, weeds) Start ipratropium bromide nasal spray 0.03%- using 2 sprays in each nostril 2-3  times a day as needed for drainage. Start saline nasal rinses to help with drainage. Use this prior to any medicated nasal sprays. Continue Zyrtec and Singulair as above Continue Flonase 2 sprays each nostril once a day as needed for stuffy nose Stop Astelin (azelastine) nasal spray Continue olopatadine 1 drop each eye once a day as needed for itchy watery eyes  Mild intermittent asthma Start Flovent 220 mcg 2 puffs twice a day with spacer to help prevent cough and wheeze. Do not use more than perscribed Continue albuterol 2 puffs every 4 hours as needed for cough, wheeze, tightness in chest, or shortness of breath. Continue Singulair as above  Pollen food allergy syndrome Continue avoidance of fresh pitted fruits as well as strawberries, bananas, and kiwi's  Food allergy Avoid pistachio and pistachio pudding. In case of an allergic reaction, give Benadryl 4  teaspoonfuls every 4 hours, and if life-threatening symptoms occur, inject with EpiPen 0.3 mg.   Please let us know if this treatment plan is not working well for you. Schedule a follow-up appointment in 3 months   Return in about 3 months (around 10/17/2020), or if symptoms worsen or fail to improve.    Thank you for the opportunity to care for this patient.  Please do not hesitate  to contact me with questions.  Althea Charon, FNP [facility]

## 2020-07-20 ENCOUNTER — Other Ambulatory Visit: Payer: Self-pay

## 2020-07-20 ENCOUNTER — Telehealth: Payer: Self-pay | Admitting: Family

## 2020-07-20 DIAGNOSIS — L2084 Intrinsic (allergic) eczema: Secondary | ICD-10-CM

## 2020-07-20 MED ORDER — IPRATROPIUM BROMIDE 0.03 % NA SOLN: NASAL | 0 refills | Status: DC

## 2020-07-20 MED ORDER — HYDROCORTISONE 2.5 % EX LOTN: TOPICAL_LOTION | CUTANEOUS | 3 refills | Status: DC

## 2020-07-20 MED ORDER — FLOVENT HFA 220 MCG/ACT IN AERO: 2.0000 | INHALATION_SPRAY | Freq: Two times a day (BID) | RESPIRATORY_TRACT | 5 refills | Status: DC

## 2020-07-20 NOTE — Telephone Encounter (Signed)
Patient called and said that the hydrocortisone 2.5 was not mix with a compound of 1:1 ratio with eucerin 264ml. And she needs 3 month supply. With refills. [facility] [address] . [phone]

## 2020-07-20 NOTE — Telephone Encounter (Signed)
Refills sent in and patient was notified

## 2020-07-20 NOTE — Telephone Encounter (Signed)
Refill sent in to pharmacy. Patient notified

## 2020-07-20 NOTE — Telephone Encounter (Signed)
Patient was seen 07/18/20, by Chrissie. She is requesting that all her, New prescriptions, that Chrissie prescribed, be resent at as 3 month supply.

## 2020-07-22 ENCOUNTER — Emergency Department (HOSPITAL_BASED_OUTPATIENT_CLINIC_OR_DEPARTMENT_OTHER): Payer: Medicaid Other

## 2020-07-22 ENCOUNTER — Other Ambulatory Visit: Payer: Self-pay

## 2020-07-22 ENCOUNTER — Ambulatory Visit (HOSPITAL_COMMUNITY)
Admission: EM | Admit: 2020-07-22 | Discharge: 2020-07-22 | Disposition: A | Discharge status: Home / Self Care | Payer: Medicaid Other

## 2020-07-22 ENCOUNTER — Encounter (HOSPITAL_BASED_OUTPATIENT_CLINIC_OR_DEPARTMENT_OTHER): Payer: Self-pay

## 2020-07-22 DIAGNOSIS — I10 Essential (primary) hypertension: Secondary | ICD-10-CM | POA: Diagnosis not present

## 2020-07-22 DIAGNOSIS — M79604 Pain in right leg: Secondary | ICD-10-CM | POA: Insufficient documentation

## 2020-07-22 DIAGNOSIS — J45909 Unspecified asthma, uncomplicated: Secondary | ICD-10-CM | POA: Insufficient documentation

## 2020-07-22 DIAGNOSIS — Z79899 Other long term (current) drug therapy: Secondary | ICD-10-CM | POA: Insufficient documentation

## 2020-07-22 DIAGNOSIS — M79605 Pain in left leg: Secondary | ICD-10-CM | POA: Insufficient documentation

## 2020-07-22 DIAGNOSIS — Z7952 Long term (current) use of systemic steroids: Secondary | ICD-10-CM | POA: Insufficient documentation

## 2020-07-22 DIAGNOSIS — R072 Precordial pain: Secondary | ICD-10-CM | POA: Insufficient documentation

## 2020-07-22 LAB — CBC
HCT: 35.8 % — ABNORMAL LOW (ref 36.0–46.0)
Hemoglobin: 11.9 g/dL — ABNORMAL LOW (ref 12.0–15.0)
MCH: 29.1 pg (ref 26.0–34.0)
MCHC: 33.2 g/dL (ref 30.0–36.0)
MCV: 87.5 fL (ref 80.0–100.0)
Platelets: 290 10*3/uL (ref 150–400)
RBC: 4.09 MIL/uL (ref 3.87–5.11)
RDW: 12.2 % (ref 11.5–15.5)
WBC: 7.7 10*3/uL (ref 4.0–10.5)
nRBC: 0 % (ref 0.0–0.2)

## 2020-07-22 LAB — BASIC METABOLIC PANEL
Anion gap: 10 (ref 5–15)
BUN: 12 mg/dL (ref 6–20)
CO2: 25 mmol/L (ref 22–32)
Calcium: 9 mg/dL (ref 8.9–10.3)
Chloride: 102 mmol/L (ref 98–111)
Creatinine, Ser: 0.76 mg/dL (ref 0.44–1.00)
GFR, Estimated: >60 mL/min (ref >60)
Glucose, Bld: 94 mg/dL (ref 70–99)
Potassium: 3.8 mmol/L (ref 3.5–5.1)
Sodium: 137 mmol/L (ref 135–145)

## 2020-07-22 LAB — TROPONIN I (HIGH SENSITIVITY): Troponin I (High Sensitivity): 2 ng/L (ref <18)

## 2020-07-22 NOTE — ED Triage Notes (Signed)
Pt c/o CP, pain to right UE and bialt LE/"calves"-c/o x 1 week-NAD-steady gait

## 2020-07-22 NOTE — ED Provider Notes (Addendum)
49 year old female presents with central chest pain, right arm and elbow pain, bilateral lower leg pain- left greater than right for the past 2 to 3 days getting worse today. Pain increases with movement. She is concerned over etiology and possible blood clot. She has a history of HTN, hyperlipidemia, arthritis, asthma, and GERD and is on over 15 different medications.  Patient is alert, taking in complete sentences and is not in acute distress.  Discussed with patient that she needs additional work-up including labwork and imaging that is not available at Urgent Care, especially at 7:30pm. She keeps insisting that we put her on blood thinners and give her an order for imaging. She has driven around the area to Primary Care and other Urgent Cares that have told her the same thing- that she needs additional care and evaluation at the Emergency Department. Continued to discuss with her that she should go to the Emergency Room and that she needs additional work up that is not appropriate for any Urgent Care or Primary Care at this time. Patient argued that she has family with her and she is not able to wait in the Emergency Room for hours.  Patient was first told at front desk that Urgent Care is not the appropriate center at this time for her concerns but she continued to argue. She was taken back to triage room, the nurse explained the situation again that we are not able to get immediate lab results and imaging that may be needed to evaluate her concerns. She kept saying that "I just want the doctor to look at me and prescribe me blood thinners". I explained again to her that there are Nurse Practitioners and Physician Assistants here tonight and that we are briefly evaluating her and I feel that she needs additional care and evaluation that is not available here at Urgent Care.  Again- reinforced with her the importance of having the evaluation tonight at the Emergency Room. Patient declined ambulance transport  from Urgent Care to the ER and left Urgent Care by personal vehicle.     Katy Apo, NP 07/22/20 2004    Katy Apo, NP 07/22/20 2124

## 2020-07-23 ENCOUNTER — Encounter (HOSPITAL_BASED_OUTPATIENT_CLINIC_OR_DEPARTMENT_OTHER): Payer: Self-pay

## 2020-07-23 ENCOUNTER — Emergency Department (HOSPITAL_BASED_OUTPATIENT_CLINIC_OR_DEPARTMENT_OTHER)
Admission: EM | Admit: 2020-07-23 | Discharge: 2020-07-23 | Disposition: A | Discharge status: Home / Self Care | Payer: Medicaid Other | Attending: Emergency Medicine | Admitting: Emergency Medicine

## 2020-07-23 ENCOUNTER — Emergency Department (HOSPITAL_BASED_OUTPATIENT_CLINIC_OR_DEPARTMENT_OTHER): Payer: Medicaid Other

## 2020-07-23 DIAGNOSIS — R079 Chest pain, unspecified: Secondary | ICD-10-CM

## 2020-07-23 DIAGNOSIS — R252 Cramp and spasm: Secondary | ICD-10-CM

## 2020-07-23 LAB — D-DIMER, QUANTITATIVE: D-Dimer, Quant: 0.62 ug/mL-FEU — ABNORMAL HIGH (ref 0.00–0.50)

## 2020-07-23 LAB — CK: Total CK: 46 U/L (ref 38–234)

## 2020-07-23 LAB — TROPONIN I (HIGH SENSITIVITY): Troponin I (High Sensitivity): 2 ng/L (ref <18)

## 2020-07-23 MED ORDER — ACETAMINOPHEN 500 MG PO TABS
1000.0000 mg | ORAL_TABLET | Freq: Once | ORAL | Status: AC
  Administered 2020-07-23: 1000 mg via ORAL
  Filled 2020-07-23: qty 2

## 2020-07-23 MED ORDER — IOHEXOL 350 MG/ML SOLN
100.0000 mL | Freq: Once | INTRAVENOUS | Status: AC | PRN
  Administered 2020-07-23: 100 mL via INTRAVENOUS

## 2020-07-23 NOTE — ED Provider Notes (Signed)
[facility] EMERGENCY DEPARTMENT Provider Note   CSN: [id number] Arrival date & time: 07/22/20  2230     History Chief Complaint  Patient presents with  . Chest Pain    [name] is a 49 y.o. female.   Chest Pain Pain location:  Substernal area and L chest Pain quality: aching and sharp   Pain severity:  Mild Duration:  3 days Timing:  Constant Chronicity:  New Context: not breathing   Relieved by:  None tried Worsened by:  Nothing Ineffective treatments:  None tried      Past Medical History:  Diagnosis Date  . Arthritis    degenerative disc disease-work related injury  . Asthma    controlled-last used rescue inhaler 3 months ago  . Eczema   . Fibroids   . GERD (gastroesophageal reflux disease)    zantac/pepcid prn  . Headache(784.0)   . High cholesterol   . Hypertension   . Pre-eclampsia   . Pre-eclampsia   . Urticaria     Patient Active Problem List   Diagnosis Date Noted  . Fibroid, uterine 03/21/2015  . Hyperlipidemia 11/24/2012  . 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 Hypertension 03/12/2012  . Asthma 03/05/2012  . Seasonal allergic rhinitis 03/05/2012    Past Surgical History:  Procedure Laterality Date  . CESAREAN SECTION  2011  . fibroidectomy    . SPINAL FUSION  2006   L4-5  . WISDOM TOOTH EXTRACTION       OB History    Gravida  1   Para  1   Term      Preterm      AB      Living  1     SAB      TAB      Ectopic      Multiple      Live Births              Family History  Problem Relation Age of Onset  . Hypertension Mother   . High Cholesterol Mother   . Pneumonia Father   . Liver disease Father   . Hypertension Other   . Stroke Other   . High Cholesterol Other   . Breast cancer Maternal Aunt   . Pancreatic cancer Maternal Uncle   . Allergic rhinitis Neg Hx   . Angioedema Neg Hx   . Asthma Neg Hx   . Eczema Neg Hx   . Immunodeficiency Neg Hx   . Urticaria Neg Hx     Social History    Tobacco Use  . Smoking status: Never Smoker  . Smokeless tobacco: Never Used  Vaping Use  . Vaping Use: Never used  Substance Use Topics  . Alcohol use: No  . Drug use: No    Home Medications Prior to Admission medications   Medication Sig Start Date End Date Taking? Authorizing Provider  albuterol (PROAIR HFA) 108 (90 Base) MCG/ACT inhaler 2 puffs by mouth every 4-6 hours as needed. 07/25/18   Kennith Gain, MD  azelastine (ASTELIN) 0.1 % nasal spray Place 2 sprays into both nostrils 2 (two) times daily. Use in each nostril as directed 12/24/19   Kennith Gain, MD  cetirizine (ZYRTEC) 10 MG tablet Take 1 tablet (10 mg total) by mouth 2 (two) times daily. 12/24/19   Kennith Gain, MD  cyclobenzaprine (FLEXERIL) 10 MG tablet Take 10 mg by mouth at bedtime as needed. 11/20/16   [provider]  diclofenac (VOLTAREN)  75 MG EC tablet TAKE 1 TABLET BY MOUTH TWICE A DAY AFTER MEALS FOR INFLAMMATION/PAIN/SWELLING AS NEEDED Patient not taking: Reported on 07/18/2020 11/20/16   [provider]  DUPILUMAB, ASTHMA,  Inject into the skin.    [provider]  DUPIXENT 300 MG/2ML prefilled syringe INJECT THE CONTENTS OF 1 SYRINGE (300 MG) UNDER THE SKIN EVERY 2 WEEKS 05/19/20   Padgett, Rae Halsted, MD  DUPIXENT 300 MG/2ML prefilled syringe INJECT 300 MG UNDER THE SKIN EVERY 2 WEEKS 05/18/20   Kennith Gain, MD  EPINEPHrine 0.3 mg/0.3 mL IJ SOAJ injection Use as directed, if administer call 911 07/18/20   Althea Charon, FNP  famotidine (PEPCID) 20 MG tablet Take 1 tablet (20 mg total) by mouth 2 (two) times daily. 12/24/19   Kennith Gain, MD  fluticasone (FLONASE) 50 MCG/ACT nasal spray Place 2 sprays into both nostrils daily as needed. For allergies 12/24/19   Kennith Gain, MD  fluticasone (FLOVENT HFA) 220 MCG/ACT inhaler Inhale 2 puffs into the lungs 2 (two) times daily. 07/20/20   Althea Charon, FNP   gabapentin (NEURONTIN) 600 MG tablet Take 600 mg by mouth 2 (two) times daily. 01/26/20   [provider]  hydrocortisone 2.5 % lotion Apply once daily as needed to red itchy areas. 07/20/20   Althea Charon, FNP  hydrOXYzine (ATARAX/VISTARIL) 10 MG tablet TAKE 2 TABLETS BY MOUTH AT BEDTIME AS NEEDED FOR [facility] 04/30/19   Kennith Gain, MD  ipratropium (ATROVENT) 0.03 % nasal spray Use 2 sprays in each nostril two to three times a day as needed for drainage. 07/20/20   Althea Charon, FNP  labetalol (NORMODYNE) 200 MG tablet Take 200 mg by mouth 2 (two) times daily.    [provider]  lidocaine (XYLOCAINE) 2 % solution SWISH AND SPIT WITH 10 ML'S EVERY 4 HOURS AS NEEDED FOR THROAT PAIN 11/28/16   [provider]  lisinopril-hydrochlorothiazide (PRINZIDE,ZESTORETIC) 10-12.5 MG tablet Take 1 tablet by mouth daily. 10/30/16   [provider]  meloxicam (MOBIC) 7.5 MG tablet meloxicam 7.5 mg tablet  TAKE 1 TAB BY MOUTH DAILY WITH FOOD 03/04/17   [provider]  montelukast (SINGULAIR) 10 MG tablet Take 1 tablet (10 mg total) by mouth at bedtime. 12/24/19   Kennith Gain, MD  mupirocin ointment (BACTROBAN) 2 % Apply 1 application topically as needed (for wound).  05/14/14   [provider]  naproxen (NAPROSYN) 500 MG tablet Take 1 tablet (500 mg total) by mouth 2 (two) times daily as needed for mild pain, moderate pain or headache (TAKE WITH MEALS.). 08/01/14   Street, [place], PA-C  nystatin-triamcinolone (MYCOLOG II) cream Apply to affected area (behind both ears) TID x 10 days Patient not taking: Reported on 07/18/2020 09/27/12   Schorr, Rhetta Mura, FNP  Olopatadine HCl (PATADAY) 0.2 % SOLN Apply 1 drop to eye daily as needed (FOR ALLERGIES). 12/24/19   Kennith Gain, MD  ondansetron (ZOFRAN-ODT) 4 MG disintegrating tablet One tablet every eight hours as needed 07/24/18   Kennith Gain, MD  pantoprazole  (PROTONIX) 40 MG tablet Take 40 mg by mouth at bedtime. 07/19/14   [provider]  potassium chloride SA (KLOR-CON M20) 20 MEQ tablet TAKE 1 TABLET BY MOUTH TWICE A DAY 01/02/18   [provider]  PREDNISONE PO Take by mouth. Patient not taking: Reported on 07/18/2020    [provider]  ranitidine (ZANTAC) 150 MG tablet TAKE 1 TABLET BY MOUTH TWICE A  DAY 10/14/17   Kennith Gain, MD  traMADol (ULTRAM) 50 MG tablet Take 1 tablet (50 mg total) by mouth every 6 (six) hours as needed. 08/12/13   Janne Napoleon, NP  ZETIA 10 MG tablet Take 10 mg by mouth daily. 07/01/17   [provider]    Allergies    Plum pulp, Prunus persica, Strawberry extract, Aspirin, Fruit & vegetable daily [nutritional supplements], Methocarbamol, Morphine and related, Percocet [oxycodone-acetaminophen], Shrimp [shellfish allergy], Simvastatin, Methocarbamol, Morphine, Oxycodone-acetaminophen, Pistachio nut (diagnostic), and Saccharin  Review of Systems   Review of Systems  Cardiovascular: Positive for chest pain.  All other systems reviewed and are negative.   Physical Exam Updated Vital Signs BP (!) 141/86 (BP Location: Right Arm)   Pulse 86   Temp 98.2 F (36.8 C) (Oral)   Resp 18   Ht 5' (1.524 m)   Wt 64.4 kg   LMP 07/08/2020   SpO2 100%   BMI 27.73 kg/m   Physical Exam Vitals and nursing note reviewed.  Constitutional:      Appearance: She is well-developed.  HENT:     Head: Normocephalic and atraumatic.     Nose: No congestion or rhinorrhea.     Mouth/Throat:     Mouth: Mucous membranes are moist.     Pharynx: Oropharynx is clear.  Eyes:     Pupils: Pupils are equal, round, and reactive to light.  Cardiovascular:     Rate and Rhythm: Normal rate and regular rhythm.  Pulmonary:     Effort: No respiratory distress.     Breath sounds: No stridor.  Abdominal:     General: Abdomen is flat. There is no distension.  Musculoskeletal:        General:  No swelling or tenderness. Normal range of motion.     Cervical back: Normal range of motion.  Skin:    General: Skin is warm and dry.  Neurological:     General: No focal deficit present.     Mental Status: She is alert.     ED Results / Procedures / Treatments   Labs (all labs ordered are listed, but only abnormal results are displayed) Labs Reviewed  CBC - Abnormal; Notable for the following components:      Result Value   Hemoglobin 11.9 (*)    HCT 35.8 (*)    All other components within normal limits  D-DIMER, QUANTITATIVE (NOT AT [facility]) - Abnormal; Notable for the following components:   D-Dimer, Quant 0.62 (*)    All other components within normal limits  BASIC METABOLIC PANEL  CK  TROPONIN I (HIGH SENSITIVITY)  TROPONIN I (HIGH SENSITIVITY)    EKG EKG Interpretation  Date/Time:  Friday July 22 2020 22:41:49 EST Ventricular Rate:  90 PR Interval:  118 QRS Duration: 72 QT Interval:  342 QTC Calculation: 418 R Axis:   63 Text Interpretation: Normal sinus rhythm with sinus arrhythmia Nonspecific ST and T wave abnormality Abnormal ECG No significant change since last tracing No STEMI Confirmed by Nanda Quinton [phone]) on 07/22/2020 11:23:31 PM   Radiology DG Chest 2 View  Result Date: 07/22/2020 CLINICAL DATA:  49 year old female with chest pain. EXAM: CHEST - 2 VIEW COMPARISON:  Chest radiograph dated 05/01/2010 FINDINGS: The heart size and mediastinal contours are within normal limits. Both lungs are clear. The visualized skeletal structures are unremarkable. IMPRESSION: No active cardiopulmonary disease. Electronically Signed   By: Anner Crete M.D.   On: 07/22/2020 23:32   CT Angio Chest  PE W and/or Wo Contrast  Result Date: 07/23/2020 CLINICAL DATA:  Chest pain, pain to RIGHT upper extremity and bilateral lower extremities for 1 week. EXAM: CT ANGIOGRAPHY CHEST WITH CONTRAST TECHNIQUE: Multidetector CT imaging of the chest was performed using the standard  protocol during bolus administration of intravenous contrast. Multiplanar CT image reconstructions and MIPs were obtained to evaluate the vascular anatomy. CONTRAST:  170mL OMNIPAQUE IOHEXOL 350 MG/ML SOLN COMPARISON:  Cardiac CT dated 06/12/2018. FINDINGS: Cardiovascular: There is no pulmonary embolism identified within the main, lobar or segmental pulmonary arteries bilaterally. No thoracic aortic aneurysm or evidence of aortic dissection. Heart size is normal. No pericardial effusion. Mediastinum/Nodes: No mass or enlarged lymph nodes are seen within the mediastinum or perihilar regions. Esophagus is unremarkable. Trachea and central bronchi are unremarkable. Lungs/Pleura: Lungs are clear.  No pleural effusion or pneumothorax. Upper Abdomen: Limited images of the upper abdomen are unremarkable. Musculoskeletal: No acute or suspicious osseous finding. Superficial soft tissues about the chest are unremarkable. Review of the MIP images confirms the above findings. IMPRESSION: Negative exam. No pulmonary embolism seen. Lungs are clear. Electronically Signed   By: Franki Cabot M.D.   On: 07/23/2020 04:13   US Venous Img Lower Bilateral  Result Date: 07/23/2020 CLINICAL DATA:  Bilateral calf pain for 1-2 weeks with chest pain. EXAM: BILATERAL LOWER EXTREMITY VENOUS DOPPLER ULTRASOUND TECHNIQUE: Lapid-scale sonography with compression, as well as color and duplex ultrasound, were performed to evaluate the deep venous system(s) from the level of the common femoral vein through the popliteal and proximal calf veins. COMPARISON:  None. FINDINGS: VENOUS Normal compressibility of the common femoral, superficial femoral, and popliteal veins, as well as the visualized calf veins. Visualized portions of profunda femoral vein and great saphenous vein unremarkable. No filling defects to suggest DVT on grayscale or color Doppler imaging. Doppler waveforms show normal direction of venous flow, normal respiratory plasticity and  response to augmentation. Limited views of the contralateral common femoral vein are unremarkable. OTHER None. Limitations: none IMPRESSION: Negative exam.  No DVT is seen within either lower extremity. Electronically Signed   By: Franki Cabot M.D.   On: 07/23/2020 05:38    Procedures Procedures (including critical care time)  Medications Ordered in ED Medications  acetaminophen (TYLENOL) tablet 1,000 mg (1,000 mg Oral Given 07/23/20 0212)  iohexol (OMNIPAQUE) 350 MG/ML injection 100 mL (100 mLs Intravenous Contrast Given 07/23/20 0354)    ED Course  I have reviewed the triage vital signs and the nursing notes.  Pertinent labs & imaging results that were available during my care of the patient were reviewed by me and considered in my medical decision making (see chart for details).    MDM Rules/Calculators/A&P                          Patient evaluated for both DVTs and PE secondary to elevated D-dimer and her significant anxiety about having a blood clot.  These were all negative.  EKG and troponins were within normal limits I doubt that she has ACS.  I still think this is muscular in nature.  She will need follow-up with her PCP.  Stay hydrated.  Low suspicion for any emergent condition at this time.  Final Clinical Impression(s) / ED Diagnoses Final diagnoses:  Nonspecific chest pain  Leg cramps    Rx / DC Orders ED Discharge Orders    None       Joandy Burget, Corene Cornea, MD 07/23/20 2312

## 2020-07-26 ENCOUNTER — Other Ambulatory Visit: Payer: Self-pay

## 2020-07-26 ENCOUNTER — Ambulatory Visit (INDEPENDENT_AMBULATORY_CARE_PROVIDER_SITE_OTHER): Payer: Medicaid Other | Admitting: *Deleted

## 2020-07-26 DIAGNOSIS — L2084 Intrinsic (allergic) eczema: Secondary | ICD-10-CM

## 2020-07-26 DIAGNOSIS — L209 Atopic dermatitis, unspecified: Secondary | ICD-10-CM | POA: Diagnosis not present

## 2020-08-08 ENCOUNTER — Telehealth: Payer: Self-pay

## 2020-08-08 NOTE — Telephone Encounter (Signed)
Patient called to cancel her injections because she has been experiencing a lot of leg pain after getting her Dupixent injections. Patient states her symptoms began after thanksgiving. Has had several injections afterwards causing pain, and patent contacted pharmacy who is distributing the medication and stated that there are side effects. Wanted to let Dr. Nelva Bush know. She is canceling her appointment for 08/11/2020 and will need to wait and see. She is going to call PCP or orthopedic regarding her pain since she was admitted to the hospital on 07/23/2020.

## 2020-08-09 NOTE — Telephone Encounter (Signed)
Noted will move patient to inactive

## 2020-08-09 NOTE — Telephone Encounter (Signed)
Ok thanks for letting me know.   Please have her keep [place] posted if the pain seems to resolve off [place].   Will CC Tammy so she is aware also.

## 2020-08-11 ENCOUNTER — Ambulatory Visit: Payer: Self-pay

## 2020-10-04 ENCOUNTER — Other Ambulatory Visit: Payer: Self-pay | Admitting: Allergy

## 2020-10-05 ENCOUNTER — Other Ambulatory Visit: Payer: Self-pay | Admitting: Allergy

## 2021-01-20 ENCOUNTER — Other Ambulatory Visit: Payer: Self-pay | Admitting: Allergy

## 2021-01-20 DIAGNOSIS — L508 Other urticaria: Secondary | ICD-10-CM

## 2021-03-08 ENCOUNTER — Telehealth: Payer: Medicaid Other | Admitting: *Deleted

## 2021-03-08 NOTE — Telephone Encounter (Signed)
Returning call to patient from 03/07/21.  Patient called injection room to see if she had Dupixent injection still in inactive.  Patient had stopped months ago due to leg pain and wanted to start back due to   Urticaria getting worse.  Informed patient I would double check all inactive supplies and review past recommendation and call her back. Called and left message for patient to call the office. Checked the injection room supply again and inactive and did not see an injection held in the inactive supply for [place].  Last injection was in December and was stopped due to leg pain. Will need more information from patient on how she has done from December till now and send to Dr. Nelva Bush.  Tammy will need to be updated too.

## 2021-03-09 NOTE — Telephone Encounter (Signed)
Called and left message for patient to return call to office.  Need to make office visit for patient.  Spoke with Tammy and she will start prior authorization.  Per Tammy, okay to use sample of Dupixent to restart patient with loading dose if given okay at office visit.

## 2021-03-10 NOTE — Telephone Encounter (Signed)
Patient has been scheduled for an upcoming office visit on 03/15/21 with Webb Silversmith. I updated the office visit note regarding restarting Dupixent and receiving sample if ok per Webb Silversmith.

## 2021-03-15 ENCOUNTER — Other Ambulatory Visit: Payer: Self-pay

## 2021-03-15 ENCOUNTER — Encounter: Payer: Self-pay | Admitting: Family Medicine

## 2021-03-15 ENCOUNTER — Ambulatory Visit (INDEPENDENT_AMBULATORY_CARE_PROVIDER_SITE_OTHER): Payer: Medicaid Other | Admitting: Family Medicine

## 2021-03-15 VITALS — BP 142/92 | HR 90 | Temp 97.9°F | Resp 16 | Ht 60.0 in | Wt 148.4 lb

## 2021-03-15 DIAGNOSIS — L508 Other urticaria: Secondary | ICD-10-CM

## 2021-03-15 DIAGNOSIS — T781XXD Other adverse food reactions, not elsewhere classified, subsequent encounter: Secondary | ICD-10-CM

## 2021-03-15 DIAGNOSIS — L209 Atopic dermatitis, unspecified: Secondary | ICD-10-CM

## 2021-03-15 DIAGNOSIS — J3089 Other allergic rhinitis: Secondary | ICD-10-CM

## 2021-03-15 DIAGNOSIS — L2084 Intrinsic (allergic) eczema: Secondary | ICD-10-CM

## 2021-03-15 DIAGNOSIS — T781XXA Other adverse food reactions, not elsewhere classified, initial encounter: Secondary | ICD-10-CM | POA: Insufficient documentation

## 2021-03-15 DIAGNOSIS — H1013 Acute atopic conjunctivitis, bilateral: Secondary | ICD-10-CM

## 2021-03-15 DIAGNOSIS — J302 Other seasonal allergic rhinitis: Secondary | ICD-10-CM

## 2021-03-15 DIAGNOSIS — T7800XA Anaphylactic reaction due to unspecified food, initial encounter: Secondary | ICD-10-CM

## 2021-03-15 DIAGNOSIS — J4540 Moderate persistent asthma, uncomplicated: Secondary | ICD-10-CM | POA: Diagnosis not present

## 2021-03-15 DIAGNOSIS — H1010 Acute atopic conjunctivitis, unspecified eye: Secondary | ICD-10-CM

## 2021-03-15 MED ORDER — AZELASTINE HCL 0.1 % NA SOLN
2.0000 | Freq: Two times a day (BID) | NASAL | 5 refills | Status: DC
Start: 2021-03-15 — End: 2021-12-05

## 2021-03-15 MED ORDER — HYDROCORTISONE 2.5 % EX LOTN: TOPICAL_LOTION | CUTANEOUS | 3 refills | Status: DC

## 2021-03-15 MED ORDER — OLOPATADINE HCL 0.2 % OP SOLN
1.0000 [drp] | Freq: Every day | OPHTHALMIC | 1 refills | Status: DC | PRN
Start: 2021-03-15 — End: 2021-04-16

## 2021-03-15 MED ORDER — MONTELUKAST SODIUM 10 MG PO TABS: 10.0000 mg | ORAL_TABLET | Freq: Every day | ORAL | 1 refills | Status: DC

## 2021-03-15 MED ORDER — EPINEPHRINE 0.3 MG/0.3ML IJ SOAJ: INTRAMUSCULAR | 1 refills | Status: DC

## 2021-03-15 MED ORDER — FAMOTIDINE 20 MG PO TABS
20.0000 mg | ORAL_TABLET | Freq: Two times a day (BID) | ORAL | 5 refills | Status: DC
Start: 2021-03-15 — End: 2024-02-07

## 2021-03-15 MED ORDER — FLUTICASONE PROPIONATE HFA 220 MCG/ACT IN AERO
2.0000 | INHALATION_SPRAY | Freq: Two times a day (BID) | RESPIRATORY_TRACT | 5 refills | Status: DC
Start: 2021-03-15 — End: 2021-12-05

## 2021-03-15 MED ORDER — CETIRIZINE HCL 10 MG PO TABS: 10.0000 mg | ORAL_TABLET | Freq: Two times a day (BID) | ORAL | 5 refills | Status: DC

## 2021-03-15 MED ORDER — IPRATROPIUM BROMIDE 0.03 % NA SOLN
NASAL | 1 refills | Status: DC
Start: 2021-03-15 — End: 2023-02-14

## 2021-03-15 MED ORDER — FLUTICASONE PROPIONATE 50 MCG/ACT NA SUSP
2.0000 | Freq: Every day | NASAL | 1 refills | Status: DC | PRN
Start: 2021-03-15 — End: 2022-06-14

## 2021-03-15 MED ORDER — ALBUTEROL SULFATE HFA 108 (90 BASE) MCG/ACT IN AERS: INHALATION_SPRAY | RESPIRATORY_TRACT | 1 refills | Status: DC

## 2021-03-15 MED ORDER — DUPILUMAB 300 MG/2ML ~~LOC~~ SOSY
600.0000 mg | PREFILLED_SYRINGE | Freq: Once | SUBCUTANEOUS | Status: AC
  Administered 2021-03-15: 600 mg via SUBCUTANEOUS

## 2021-03-15 NOTE — Progress Notes (Signed)
[address] [place] [address] Dept: [phone]  FOLLOW UP NOTE  Patient ID: [name], female    DOB: 03/23/1971  Age: 50 y.o. MRN: [id number] Date of Office Visit: 03/15/2021  Assessment  Chief Complaint: Eczema (Wants to start Dupixant back up. Says she is itching.) and Asthma (Says no issue.)  HPI [name] is a 50 year old female who presents to the clinic for follow-up visit.  She was last seen in this clinic on 07/18/2020 by Althea Charon, FNP, for evaluation of asthma, allergic rhinitis, atopic dermatitis, urticaria, food allergy, and oral allergy syndrome.  At today's visit, she reports her asthma has been moderately well controlled with occasional shortness of breath and cough with vigorous activity.  She denies shortness of breath, cough, or wheeze with moderate activity or rest.  She continues montelukast 10 mg once a day, Flovent 220-2 puffs twice a day, and albuterol about once a week or once every other week with relief of symptoms.  Allergic rhinitis is reported as poorly controlled with symptoms including clear rhinorrhea, nasal congestion, sneezing, and frequent postnasal drainage.  She reports that she has tried Flonase before with moderate relief of symptoms.  She previously used azelastine which made her vomit.  She reports that she would be interested in trying ipratropium nasal spray as she did not receive this from the pharmacy at her last visit. Allergic conjunctivitis is reported as poorly controlled with symptoms including red and itchy eyes for which she is not currently using any medical intervention.  Atopic dermatitis is reported as poorly controlled with red and itchy areas occurring on her hands and on her right arm.  She continues a daily moisturizing routine as well as hydrocortisone as needed.  She has previously been on Dupixent and has stopped this medication due to leg pain that she thought may be a side effect from [place].  She  continues to experience intermittent leg pain and has not taken a Dupixent injection over the last 8 months.  She reports urticaria has been poorly controlled with hives occurring on her hands and arms.  She continues cetirizine 10 mg once a day and famotidine as needed.  She continues to avoid pistachios with no accidental ingestion or EpiPen use since her last visit to this clinic.  She also continues to avoid most fresh fruits with the exception of strawberry due to oral allergy syndrome and throat itching.  Her current medications are listed in the chart.   Drug Allergies:  Allergies  Allergen Reactions   Plum Pulp Itching and Swelling   Prunus Persica Itching and Swelling   Strawberry Extract Itching and Swelling   Aspirin Other (See Comments)    Fever; Pt can take ibuprofen without problems.   Fruit & Vegetable Daily [Nutritional Supplements] Other (See Comments)    ALL FRUIT WITH SEEDS- THROAT ITCHING   Methocarbamol Nausea Only and Other (See Comments)    sleepy   Morphine And Related Itching   Percocet [Oxycodone-Acetaminophen] Itching and Nausea Only    Pt can take plain Tylenol and Tylenol with codeine.   Shrimp [Shellfish Allergy] Itching    THROAT ITCHING   Simvastatin Other (See Comments)    Body aches   Methocarbamol    Morphine    Oxycodone-Acetaminophen    Pistachio Nut (Diagnostic) Other (See Comments)    As well as pistachio pudding   Saccharin Nausea Only    Physical Exam: BP (!) 142/92   Pulse 90  Temp 97.9 F (36.6 C) (Temporal)   Resp 16   Ht 5' (1.524 m)   Wt 148 lb 6.4 oz (67.3 kg)   SpO2 100%   BMI 28.98 kg/m    Physical Exam Vitals reviewed.  Constitutional:      Appearance: Normal appearance.  HENT:     Head: Normocephalic and atraumatic.     Right Ear: Tympanic membrane normal.     Left Ear: Tympanic membrane normal.     Nose:     Comments: Bilateral nares edematous and pale with clear nasal drainage noted.  Pharynx normal.  Ears  normal.  Eyes normal.    Mouth/Throat:     Pharynx: Oropharynx is clear.  Eyes:     Conjunctiva/sclera: Conjunctivae normal.  Cardiovascular:     Rate and Rhythm: Normal rate and regular rhythm.     Heart sounds: Normal heart sounds. No murmur heard. Pulmonary:     Effort: Pulmonary effort is normal.     Breath sounds: Normal breath sounds.     Comments: Lungs clear to auscultation Musculoskeletal:     Cervical back: Normal range of motion and neck supple.  Skin:    General: Skin is warm.     Comments: Eczematous patches noted on fingers on bilateral hands as well as on her right arm.  No open areas or drainage noted.  Neurological:     Mental Status: She is alert and oriented to person, place, and time.  Psychiatric:        Mood and Affect: Mood normal.        Behavior: Behavior normal.        Thought Content: Thought content normal.        Judgment: Judgment normal.    Diagnostics: FVC 3.00, FEV1 2.35.  Predicted FVC 2.56, predicted FEV1 2.08.  Spirometry indicates normal ventilatory function.  Assessment and Plan: 1. Moderate persistent asthma without complication   2. Seasonal and perennial allergic rhinitis   3. Seasonal allergic conjunctivitis   4. Chronic urticaria   5. Intrinsic atopic dermatitis   6. Pollen-food allergy, subsequent encounter   7. Allergy with anaphylaxis due to food     Meds ordered this encounter  Medications   Olopatadine HCl (PATADAY) 0.2 % SOLN    Sig: Apply 1 drop to eye daily as needed (FOR ALLERGIES).    Dispense:  7.5 mL    Refill:  1   montelukast (SINGULAIR) 10 MG tablet    Sig: Take 1 tablet (10 mg total) by mouth at bedtime.    Dispense:  90 tablet    Refill:  1   ipratropium (ATROVENT) 0.03 % nasal spray    Sig: Use 2 sprays in each nostril two to three times a day as needed for drainage.    Dispense:  90 mL    Refill:  1   hydrocortisone 2.5 % lotion    Sig: Apply once daily as needed to red itchy areas.    Dispense:  236  mL    Refill:  3    Mix 1:1 ratio with Eucerin 292m.   fluticasone (FLOVENT HFA) 220 MCG/ACT inhaler    Sig: Inhale 2 puffs into the lungs 2 (two) times daily.    Dispense:  12 g    Refill:  5   fluticasone (FLONASE) 50 MCG/ACT nasal spray    Sig: Place 2 sprays into both nostrils daily as needed. For allergies    Dispense:  48 g  Refill:  1   famotidine (PEPCID) 20 MG tablet    Sig: Take 1 tablet (20 mg total) by mouth 2 (two) times daily.    Dispense:  180 tablet    Refill:  5   EPINEPHrine 0.3 mg/0.3 mL IJ SOAJ injection    Sig: Use as directed, if administer call 911    Dispense:  2 each    Refill:  1    Dispense Generic Mylan or Teva.   albuterol (PROAIR HFA) 108 (90 Base) MCG/ACT inhaler    Sig: 2 puffs by mouth every 4-6 hours as needed.    Dispense:  1 each    Refill:  1   azelastine (ASTELIN) 0.1 % nasal spray    Sig: Place 2 sprays into both nostrils 2 (two) times daily. Use in each nostril as directed    Dispense:  30 mL    Refill:  5   cetirizine (ZYRTEC) 10 MG tablet    Sig: Take 1 tablet (10 mg total) by mouth 2 (two) times daily.    Dispense:  60 tablet    Refill:  5   dupilumab (DUPIXENT) prefilled syringe 600 mg     Patient Instructions  Asthma Continue montelukast 10 mg once a day to prevent cough or wheeze Continue Flovent 220 mcg 2 puffs twice a day with spacer to help prevent cough and wheeze.  Continue albuterol 2 puffs every 4 hours as needed for cough, wheeze, tightness in chest, or shortness of breath.  Allergic rhinitis (dust mite, cats, dogs, grass, cockroach, molds, trees, weeds) Start ipratropium bromide nasal spray 0.03%- using 2 sprays in each nostril 2-3 times a day as needed for drainage. Start saline nasal rinses to help with drainage. Use this prior to any medicated nasal sprays. Continue cetirizine 10 mg once a day as needed for a runny nose or itch Start Flonase 2 sprays each nostril once a day as needed for stuffy nose. In the  right nostril, point the applicator out toward the right ear. In the left nostril, point the applicator out toward the left ear  Allergic conjunctivitis Continue olopatadine 1 drop each eye once a day as needed for itchy watery eyes  Eczema Continue Dupixent injections once every 2 weeks Continue a daily moisturizing routine with Eucerin Continue hydrocortisone to red itchy areas twice a day as needed Prednisone 10 mg tablets. Take 2 tablets once a day for 4 days, then take 1 tablet on the 5th day, then stop.    Hives (urticaria) Take the least amount of medication hive remaining hive free Cetirizine (Zyrtec) '10mg'$  twice a day and famotidine (Pepcid) 20 mg twice a day. If no symptoms for 7-14 days then decrease to. Cetirizine (Zyrtec) '10mg'$  twice a day and famotidine (Pepcid) 20 mg once a day.  If no symptoms for 7-14 days then decrease to. Cetirizine (Zyrtec) '10mg'$  twice a day.  If no symptoms for 7-14 days then decrease to. Cetirizine (Zyrtec) '10mg'$  once a day.  May use Benadryl (diphenhydramine) as needed for breakthrough hives       If symptoms return, then step up dosage Keep a detailed symptom journal including foods eaten, contact with allergens, medications taken, weather changes.   Pollen food allergy syndrome Continue avoidance of fresh pitted fruits as well as strawberries, bananas, and kiwi's  Food allergy Avoid pistachio and pistachio pudding. In case of an allergic reaction, give Benadryl 4  teaspoonfuls every 4 hours, and if life-threatening symptoms occur, inject with EpiPen 0.3  mg.  Your blood pressure was elevated at today's visit. Follow up with your primary care provider for further evaluation of your blood pressure  Call the clinic if this treatment plan is not working well for you  Follow up in 3 months or sooner if needed.   Return in about 3 months (around 06/15/2021), or if symptoms worsen or fail to improve.    Thank you for the opportunity to care for this  patient.  Please do not hesitate to contact me with questions.  Gareth Morgan, FNP [facility]

## 2021-03-15 NOTE — Patient Instructions (Addendum)
Asthma Continue montelukast 10 mg once a day to prevent cough or wheeze Continue Flovent 220 mcg 2 puffs twice a day with spacer to help prevent cough and wheeze.  Continue albuterol 2 puffs every 4 hours as needed for cough, wheeze, tightness in chest, or shortness of breath.   Allergic rhinitis (dust mite, cats, dogs, grass, cockroach, molds, trees, weeds) Start ipratropium bromide nasal spray 0.03%- using 2 sprays in each nostril 2-3 times a day as needed for drainage. Start saline nasal rinses to help with drainage. Use this prior to any medicated nasal sprays. Continue cetirizine 10 mg once a day as needed for a runny nose or itch Start Flonase 2 sprays each nostril once a day as needed for stuffy nose. In the right nostril, point the applicator out toward the right ear. In the left nostril, point the applicator out toward the left ear  Allergic conjunctivitis Continue olopatadine 1 drop each eye once a day as needed for itchy watery eyes  Eczema Continue Dupixent injections once every 2 weeks Continue a daily moisturizing routine with Eucerin Continue hydrocortisone to red itchy areas twice a day as needed Prednisone 10 mg tablets. Take 2 tablets once a day for 4 days, then take 1 tablet on the 5th day, then stop.    Hives (urticaria) Take the least amount of medication hive remaining hive free Cetirizine (Zyrtec) '10mg'$  twice a day and famotidine (Pepcid) 20 mg twice a day. If no symptoms for 7-14 days then decrease to. Cetirizine (Zyrtec) '10mg'$  twice a day and famotidine (Pepcid) 20 mg once a day.  If no symptoms for 7-14 days then decrease to. Cetirizine (Zyrtec) '10mg'$  twice a day.  If no symptoms for 7-14 days then decrease to. Cetirizine (Zyrtec) '10mg'$  once a day.  May use Benadryl (diphenhydramine) as needed for breakthrough hives       If symptoms return, then step up dosage Keep a detailed symptom journal including foods eaten, contact with allergens, medications taken, weather  changes.   Pollen food allergy syndrome Continue avoidance of fresh pitted fruits as well as strawberries, bananas, and kiwi's  Food allergy Avoid pistachio and pistachio pudding. In case of an allergic reaction, give Benadryl 4  teaspoonfuls every 4 hours, and if life-threatening symptoms occur, inject with EpiPen 0.3 mg.  Your blood pressure was elevated at today's visit. Follow up with your primary care provider for further evaluation of your blood pressure  Call the clinic if this treatment plan is not working well for you  Follow up in 3 months or sooner if needed.  Reducing Pollen Exposure The American Academy of Allergy, Asthma and Immunology suggests the following steps to reduce your exposure to pollen during allergy seasons. Do not hang sheets or clothing out to dry; pollen may collect on these items. Do not mow lawns or spend time around freshly cut grass; mowing stirs up pollen. Keep windows closed at night.  Keep car windows closed while driving. Minimize morning activities outdoors, a time when pollen counts are usually at their highest. Stay indoors as much as possible when pollen counts or humidity is high and on windy days when pollen tends to remain in the air longer. Use air conditioning when possible.  Many air conditioners have filters that trap the pollen spores. Use a HEPA room air filter to remove pollen form the indoor air you breathe.   Control of Dust Mite Allergen Dust mites play a major role in allergic asthma and rhinitis. They occur in  environments with high humidity wherever human skin is found. Dust mites absorb humidity from the atmosphere (ie, they do not drink) and feed on organic matter (including shed human and animal skin). Dust mites are a microscopic type of insect that you cannot see with the naked eye. High levels of dust mites have been detected from mattresses, pillows, carpets, upholstered furniture, bed covers, clothes, soft toys and any woven  material. The principal allergen of the dust mite is found in its feces. A gram of dust may contain 1,000 mites and 250,000 fecal particles. Mite antigen is easily measured in the air during house cleaning activities. Dust mites do not bite and do not cause harm to humans, other than by triggering allergies/asthma.  Ways to decrease your exposure to dust mites in your home:  1. Encase mattresses, box springs and pillows with a mite-impermeable barrier or cover  2. Wash sheets, blankets and drapes weekly in hot water (130 F) with detergent and dry them in a dryer on the hot setting.  3. Have the room cleaned frequently with a vacuum cleaner and a damp dust-mop. For carpeting or rugs, vacuuming with a vacuum cleaner equipped with a high-efficiency particulate air (HEPA) filter. The dust mite allergic individual should not be in a room which is being cleaned and should wait 1 hour after cleaning before going into the room.  4. Do not sleep on upholstered furniture (eg, couches).  5. If possible removing carpeting, upholstered furniture and drapery from the home is ideal. Horizontal blinds should be eliminated in the rooms where the person spends the most time (bedroom, study, television room). Washable vinyl, roller-type shades are optimal.  6. Remove all non-washable stuffed toys from the bedroom. Wash stuffed toys weekly like sheets and blankets above.  7. Reduce indoor humidity to less than 50%. Inexpensive humidity monitors can be purchased at most hardware stores. Do not use a humidifier as can make the problem worse and are not recommended.  Control of Dog or Cat Allergen Avoidance is the best way to manage a dog or cat allergy. If you have a dog or cat and are allergic to dog or cats, consider removing the dog or cat from the home. If you have a dog or cat but don't want to find it a new home, or if your family wants a pet even though someone in the household is allergic, here are some  strategies that may help keep symptoms at bay:  Keep the pet out of your bedroom and restrict it to only a few rooms. Be advised that keeping the dog or cat in only one room will not limit the allergens to that room. Don't pet, hug or kiss the dog or cat; if you do, wash your hands with soap and water. High-efficiency particulate air (HEPA) cleaners run continuously in a bedroom or living room can reduce allergen levels over time. Regular use of a high-efficiency vacuum cleaner or a central vacuum can reduce allergen levels. Giving your dog or cat a bath at least once a week can reduce airborne allergen.  Control of Cockroach Allergen  Cockroach allergen has been identified as an important cause of acute attacks of asthma, especially in urban settings.  There are fifty-five species of cockroach that exist in the [place], however only three, the [place], [place] species produce allergen that can affect patients with Asthma.  Allergens can be obtained from fecal particles, egg casings and secretions from cockroaches.    Remove  food sources. Reduce access to water. Seal access and entry points. Spray runways with 0.5-1% Diazinon or Chlorpyrifos Blow boric acid power under stoves and refrigerator. Place bait stations (hydramethylnon) at feeding sites.  Control of Mold Allergen Mold and fungi can grow on a variety of surfaces provided certain temperature and moisture conditions exist.  Outdoor molds grow on plants, decaying vegetation and soil.  The major outdoor mold, Alternaria and Cladosporium, are found in very high numbers during hot and dry conditions.  Generally, a late Summer - Fall peak is seen for common outdoor fungal spores.  Rain will temporarily lower outdoor mold spore count, but counts rise rapidly when the rainy period ends.  The most important indoor molds are Aspergillus and Penicillium.  Dark, humid and poorly ventilated basements are ideal sites for mold growth.   The next most common sites of mold growth are the bathroom and the kitchen.  Outdoor [employer] Use air conditioning and keep windows closed Avoid exposure to decaying vegetation. Avoid leaf raking. Avoid grain handling. Consider wearing a face mask if working in moldy areas.  Indoor Mold Control Maintain humidity below 50%. Clean washable surfaces with 5% bleach solution. Remove sources e.g. Contaminated carpets.

## 2021-03-21 MED ORDER — DUPIXENT 300 MG/2ML ~~LOC~~ SOSY
PREFILLED_SYRINGE | SUBCUTANEOUS | 12 refills | Status: DC
Start: 2021-03-21 — End: 2022-03-12

## 2021-03-21 NOTE — Telephone Encounter (Signed)
Called and l/m for patient advising approval and rx to [facility] again

## 2021-03-29 ENCOUNTER — Ambulatory Visit: Payer: Self-pay

## 2021-04-16 ENCOUNTER — Other Ambulatory Visit: Payer: Self-pay | Admitting: Allergy

## 2021-04-16 MED ORDER — POLYMYXIN B-TRIMETHOPRIM 10000-0.1 UNIT/ML-% OP SOLN: 1.0000 [drp] | OPHTHALMIC | 0 refills | Status: AC

## 2021-04-16 MED ORDER — OLOPATADINE HCL 0.2 % OP SOLN
1.0000 [drp] | Freq: Every day | OPHTHALMIC | 1 refills | Status: DC | PRN
Start: 2021-04-16 — End: 2023-02-14

## 2021-04-16 NOTE — Progress Notes (Signed)
Pt call saying her eyes are itchy, red and discharge and crusting.   She is on dupixent and concerned it could be related to that.   Will refill her pataday and have called in polytrim.   She has been on dupixent in the past without issue.   Less likely medication induced conjunctivitis due to itching and discharge she is having.     [place] [facility] please check on pt and see how her eyes are doing.

## 2021-04-17 NOTE — Progress Notes (Signed)
Called and left a voicemail asking for patient to return call to discuss how she is doing.

## 2021-04-18 NOTE — Progress Notes (Signed)
Patient stated that her eyes are still itching and burning and were stuck today a little bit. She stated that her eyes were blurry and she hasn't been able to drive and get her medication. She has been using the Pataday. She plans to get the medication tomorrow. She states that she isn't sure if it is from the [place] or Conjunctivitis and her allergies have been flaring badly.

## 2021-04-19 NOTE — Progress Notes (Signed)
Spoke with patient, she stated that she pick up the Polytrim and started it today. Patient is not due for an injection until next week. She stated that she will administer her Dupixent on Wednesday 04/26/2021 or Thursday 04/27/2021. Patient will call on Monday 04/24/2021 or Tuesday 04/25/2021 with an update.

## 2021-04-26 ENCOUNTER — Encounter: Payer: Self-pay | Admitting: Family Medicine

## 2021-04-26 ENCOUNTER — Telehealth: Payer: Self-pay | Admitting: Allergy

## 2021-04-26 NOTE — Telephone Encounter (Signed)
LMOM for patient to call back. Wondering if Dupixent is helping to resolve her skin and how is her eye currently doing. Any change in vision? Eye pain? Eye drainage? Are symptoms getting better or worse overall? If someone could ask these questions please. Thank you

## 2021-04-26 NOTE — Telephone Encounter (Signed)
Pt called and want to know if see if Dr Nelva Bush wants her to hold off on taking her [place]. Says she is still taking both eye drops and one eye is trying to clear up. Please Advice. Pt states if she doesn't answer you can leave her a VM to what to do

## 2021-04-26 NOTE — Telephone Encounter (Signed)
Please advise 

## 2021-04-27 NOTE — Telephone Encounter (Signed)
Called patient to go over previous note. I left a message for that patient to call the office back  in regards to her next dupixent injection and the issues with her eye.

## 2021-05-04 NOTE — Telephone Encounter (Signed)
Please advise to dupixent as pt is still having issues with eyes

## 2021-05-04 NOTE — Telephone Encounter (Signed)
Patient called and said that her eyes are not much better and they are itching and burning. Wants to know if she should come in to get her [place]  [phone]

## 2021-05-05 NOTE — Telephone Encounter (Signed)
Called and left a detailed voicemail advising per [facility] permission.

## 2021-05-05 NOTE — Telephone Encounter (Signed)
Can you please let this patient know that she needs to see an eye doctor for clarification of her eye issues. If she is having changes in her vision she needs to see an eye doctor immediately. If she is having red and itchy eyes only she can continue Dupixent and see an eye doctor less urgently. Thank you

## 2021-05-05 NOTE — Telephone Encounter (Signed)
Patient called back around 5pm last night stating that she is still having redness and itchiness in both eyes. She states that she read information on Dupixent and that if she misses her next scheduled dose she should go ahead and wait until the next scheduled dose. I advised that she can go ahead and give herself the next does but she stated that she wanted to be sure. I called and left a voicemail asking her to call back to discuss and advise about seeing an eye doctor and ask if she has had any vision changes. Is it ok for the patient to go ahead and give herself the next dose of Dupixent?

## 2021-05-05 NOTE — Telephone Encounter (Signed)
OK to administer Dupixent.

## 2021-05-05 NOTE — Telephone Encounter (Signed)
Lm for pt to see an eye dr asap if she had any further needs or questions to call us

## 2021-05-11 ENCOUNTER — Telehealth: Payer: Self-pay

## 2021-05-11 NOTE — Telephone Encounter (Signed)
Patient called stating she has a Penny Size Bump on the middle of her back. The past two days it is Hot, Itchy & Red around the bump. 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   She wants to see Dr Nelva Bush tomorrow but I let her know she didn't have anything but Chrissie had an opening @ 3:30. The patient is wanting to see if Dr Nelva Bush would allow her to come in or if she can send something in for the bump?     [facility] [address]

## 2021-05-12 NOTE — Telephone Encounter (Signed)
Pt called wondering why no one had called her to schedule an appointment for today. Patient wanted to know if Dr. Nelva Bush gave the go ahead to double book so she could take a look at the bump on her back. I did apologize to her for how long it has taken to get a reply. I did advise her of Dr. Jeralyn Ruths message. However we did not have the 3:30 available with Chrissie. Dr. Nelva Bush did have 2:50 available as we had a cancellation.   I provided patient with this information, pt stated it was too late for her to come in at 2:50 and that is why she called yesterday to be able to come in today. I did apologize to patient and expressed that we try to answer all messages in a timely manner. Patient requested I ask Dr. Nelva Bush if she would be willing to see her later than 2:50. I advised patient that time is the last time available on a Friday for Dr. Nelva Bush. Pt insisted I ask.   I spoke to Dr. Nelva Bush who stated unfortunately if she could not come in at 2:50 she recommended patient to go to an urgent care to have the bite looked at. I relayed this information to patient. Patient stated she would like Dr. Nelva Bush to take a look as she knows better about her history.   Patient then stated she was holding off on her dupixent shots as she keeps having issues that arise. Patient is worried about taking the dupixent since she has the bump and has not been able to have it looked at by Dr. Nelva Bush. Patient states her eyes are red and she would like to know if she can still come in today to get her dupixent shot.   Patient would like a reply about getting her dupixent before 4pm today. [phone].

## 2021-06-29 ENCOUNTER — Ambulatory Visit: Payer: Medicaid Other | Admitting: Allergy

## 2021-08-05 IMAGING — CT CT ANGIO CHEST
2 of 8 series · 19 of 36 positions shown · IV contrast (Omnipaque)
Comparison: Cardiac CT dated 06/12/2018.

CLINICAL DATA: Chest pain, pain to RIGHT upper extremity and
bilateral lower extremities for 1 week.

EXAM:
CT ANGIOGRAPHY CHEST WITH CONTRAST
TECHNIQUE: Multidetector CT imaging of the chest was performed using the
standard protocol during bolus administration of intravenous
contrast. Multiplanar CT image reconstructions and MIPs were
obtained to evaluate the vascular anatomy.
CONTRAST:  100mL OMNIPAQUE IOHEXOL 350 MG/ML SOLN

[Series 6: pe coronal mpr · coronal · 0.52mm/px · 1 of 104 slices shown]
[im 52/104  mediastinal]
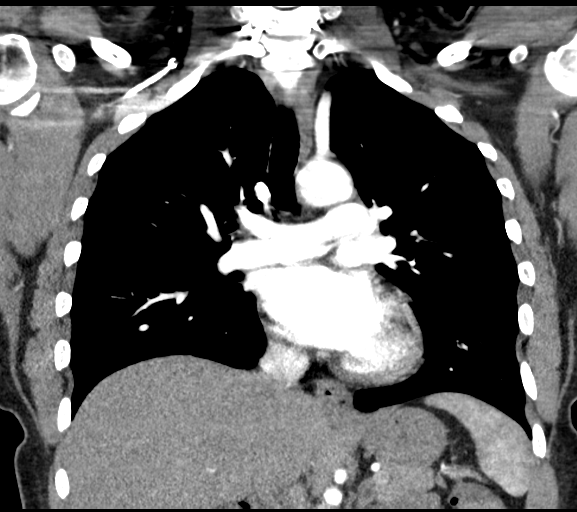

[Series 10: pe thins · axial · 0.67mm/px · z∈[+968,+1200]mm · 18 of 261 slices shown]
[im 14/261  lung]
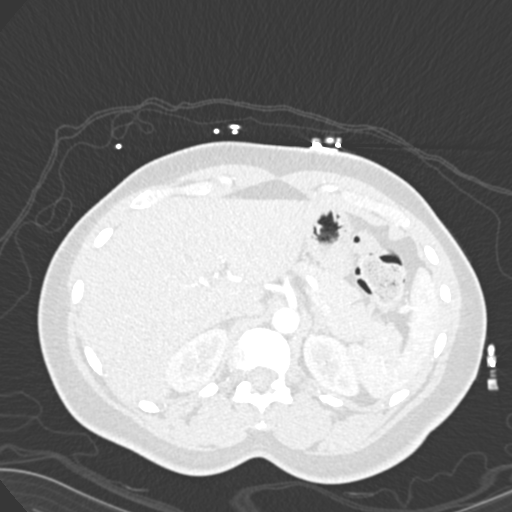
[im 28/261  mediastinal]
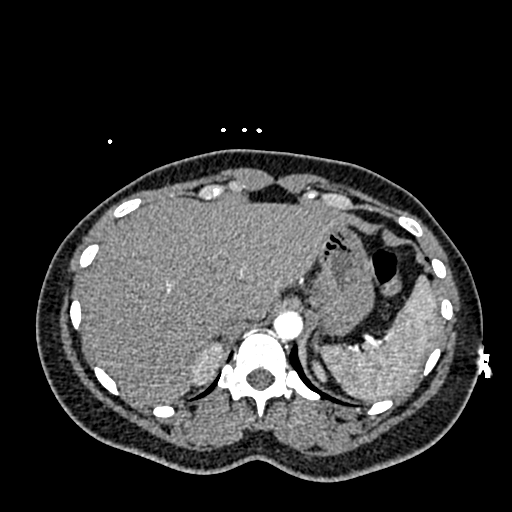
[im 42/261  lung]
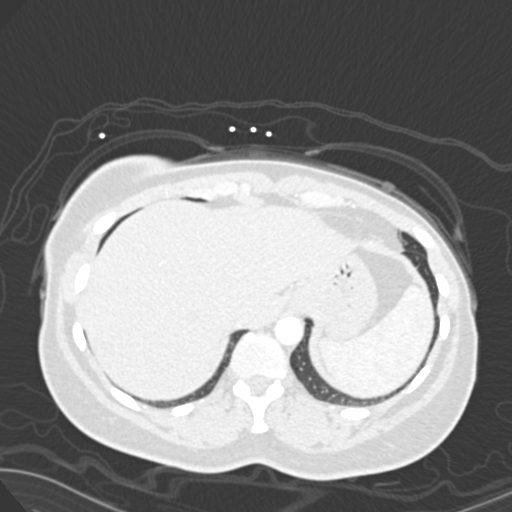
[im 55/261  mediastinal]
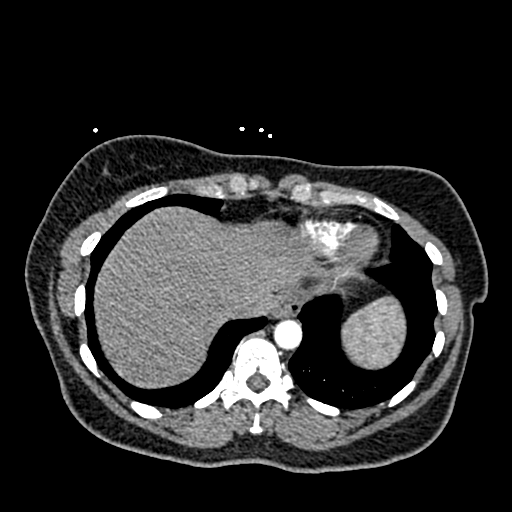
[im 69/261  lung]
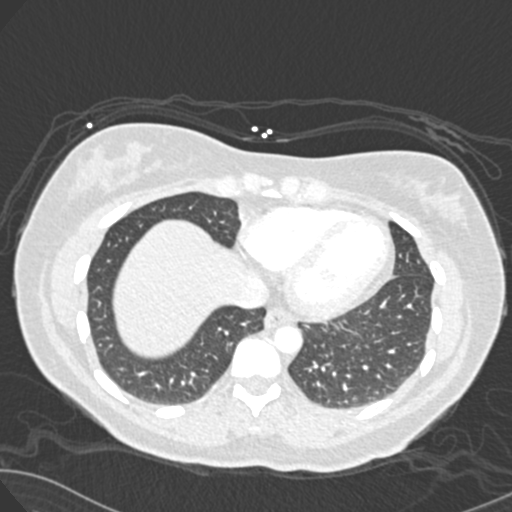
[im 83/261  mediastinal]
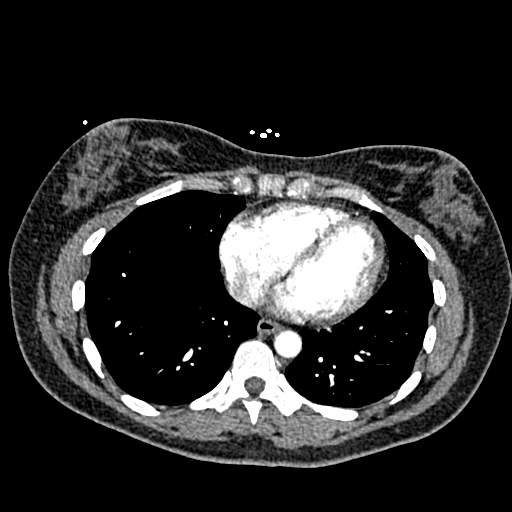
[im 96/261  lung]
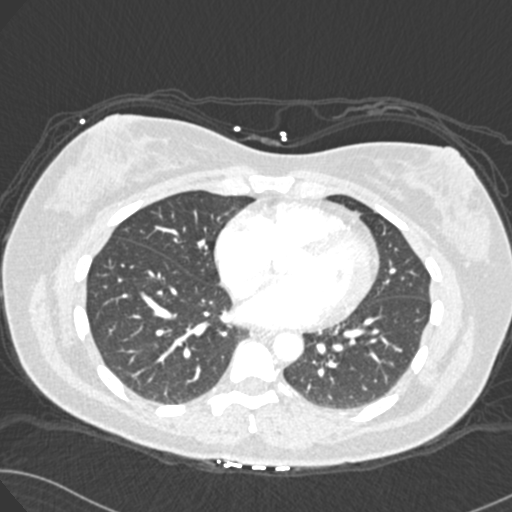
[im 110/261  mediastinal]
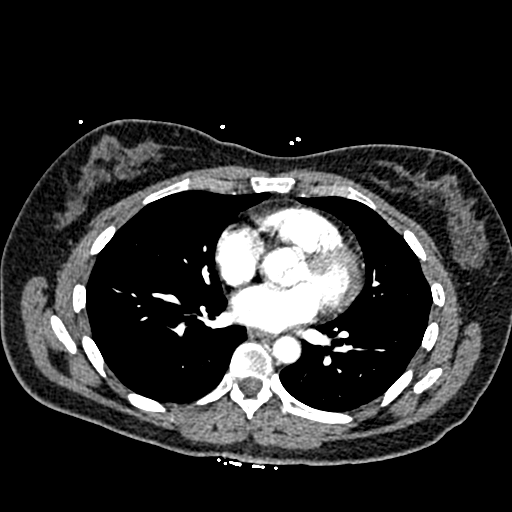
[im 124/261  lung]
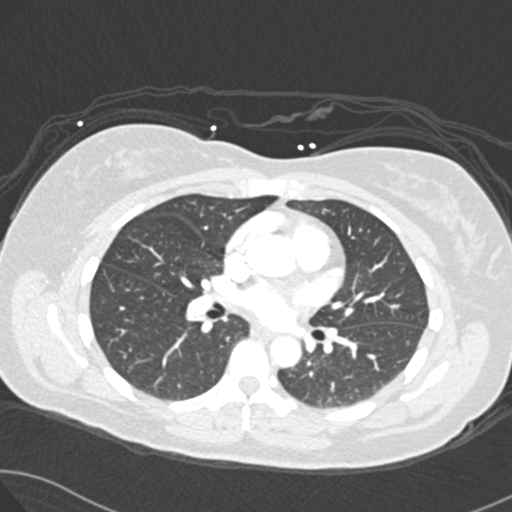
[im 137/261  mediastinal]
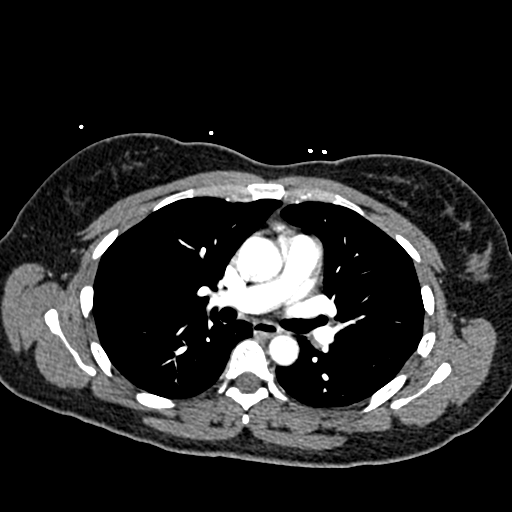
[im 151/261  lung]
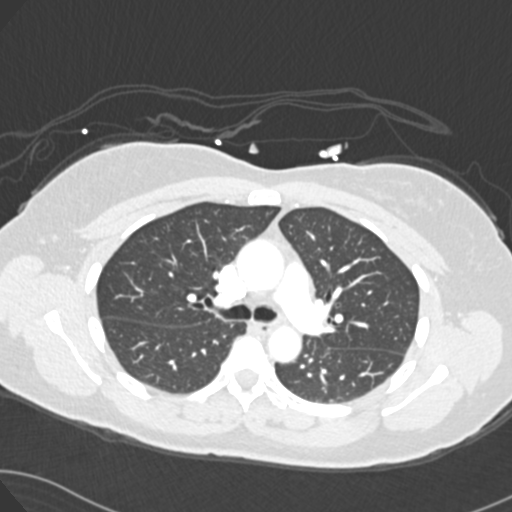
[im 165/261  mediastinal]
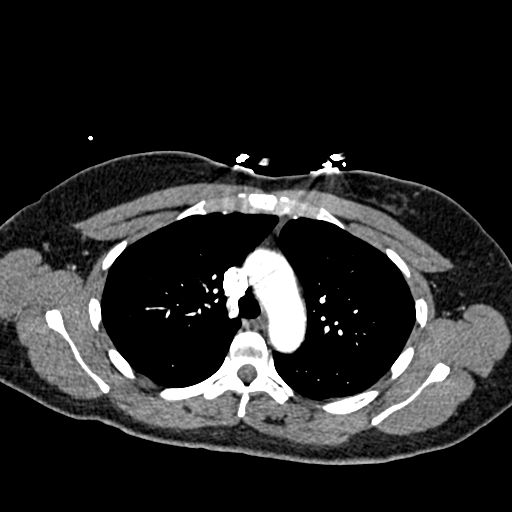
[im 178/261  lung]
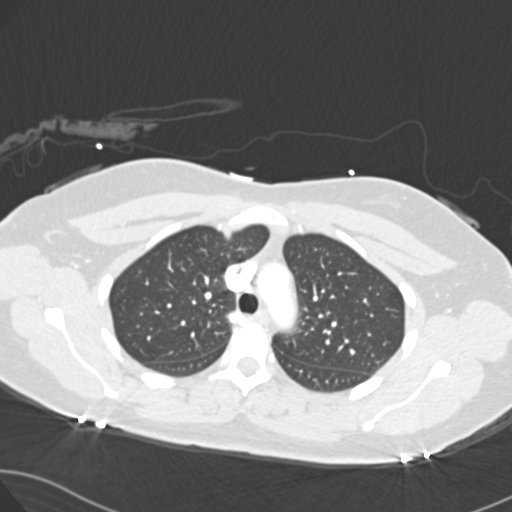
[im 192/261  mediastinal]
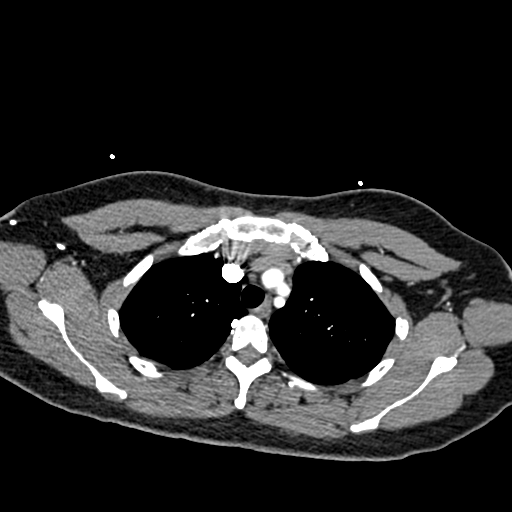
[im 206/261  lung]
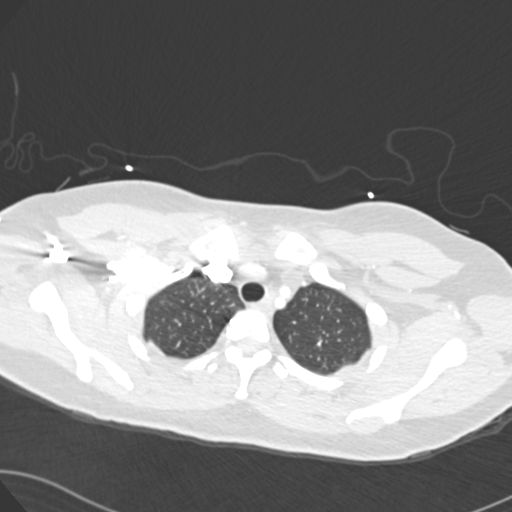
[im 219/261  mediastinal]
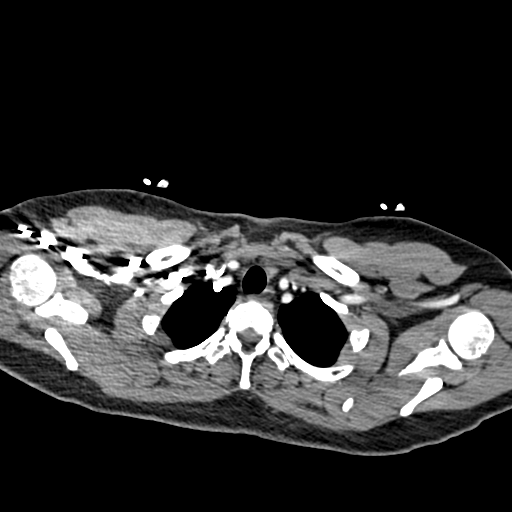
[im 233/261  lung]
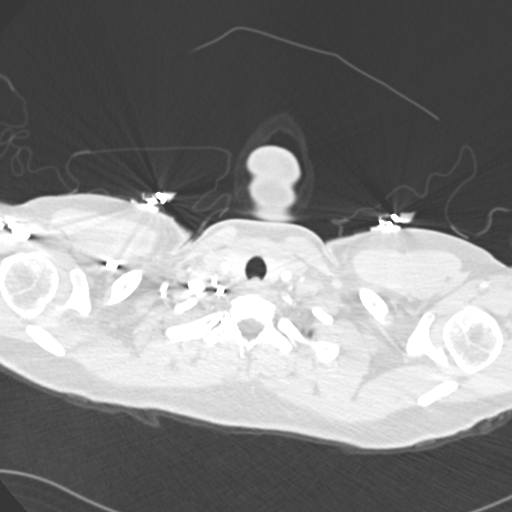
[im 247/261  mediastinal]
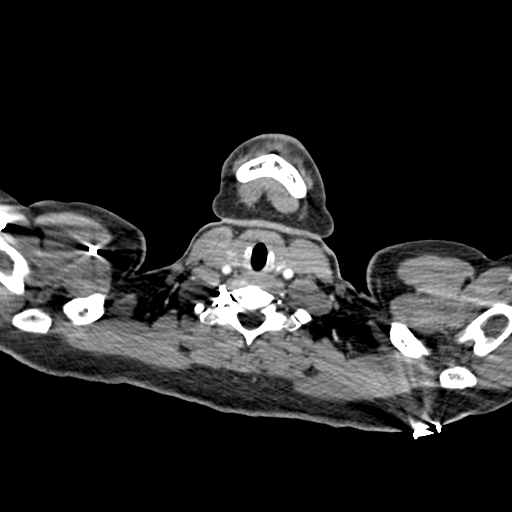

[19 of 36 positions shown; findings below may reference images not displayed]

FINDINGS: Cardiovascular: There is no pulmonary embolism identified within the
main, lobar or segmental pulmonary arteries bilaterally.

No thoracic aortic aneurysm or evidence of aortic dissection. Heart
size is normal. No pericardial effusion.

Mediastinum/Nodes: No mass or enlarged lymph nodes are seen within
the mediastinum or perihilar regions. Esophagus is unremarkable.
Trachea and central bronchi are unremarkable.

Lungs/Pleura: Lungs are clear.  No pleural effusion or pneumothorax.

Upper Abdomen: Limited images of the upper abdomen are unremarkable.

Musculoskeletal: No acute or suspicious osseous finding. Superficial
soft tissues about the chest are unremarkable.

Review of the MIP images confirms the above findings.
IMPRESSION: Negative exam. No pulmonary embolism seen. Lungs are clear.

## 2021-09-21 ENCOUNTER — Telehealth: Payer: Self-pay | Admitting: *Deleted

## 2021-09-21 NOTE — Telephone Encounter (Signed)
Called patient and advised approval and faxed same to [facility]. Also advised she is due for MD appt

## 2021-09-21 NOTE — Telephone Encounter (Signed)
[facility] called to follow up on PA for Dupxient. I advised that you were working on it, just an [place].

## 2021-10-06 ENCOUNTER — Ambulatory Visit: Payer: Medicaid Other | Admitting: Allergy

## 2021-10-12 ENCOUNTER — Ambulatory Visit: Payer: Medicaid Other | Admitting: Allergy & Immunology

## 2021-11-07 ENCOUNTER — Ambulatory Visit: Payer: Medicaid Other | Admitting: Allergy & Immunology

## 2021-12-05 ENCOUNTER — Ambulatory Visit (INDEPENDENT_AMBULATORY_CARE_PROVIDER_SITE_OTHER): Payer: Medicaid Other | Admitting: Allergy & Immunology

## 2021-12-05 ENCOUNTER — Encounter: Payer: Self-pay | Admitting: Allergy & Immunology

## 2021-12-05 VITALS — BP 156/92 | HR 104 | Temp 97.6°F | Resp 16 | Ht 60.0 in | Wt 142.4 lb

## 2021-12-05 DIAGNOSIS — J3089 Other allergic rhinitis: Secondary | ICD-10-CM | POA: Diagnosis not present

## 2021-12-05 DIAGNOSIS — L2084 Intrinsic (allergic) eczema: Secondary | ICD-10-CM

## 2021-12-05 DIAGNOSIS — T781XXD Other adverse food reactions, not elsewhere classified, subsequent encounter: Secondary | ICD-10-CM

## 2021-12-05 DIAGNOSIS — J4540 Moderate persistent asthma, uncomplicated: Secondary | ICD-10-CM

## 2021-12-05 DIAGNOSIS — L508 Other urticaria: Secondary | ICD-10-CM | POA: Diagnosis not present

## 2021-12-05 DIAGNOSIS — T7819XD Other adverse food reactions, not elsewhere classified, subsequent encounter: Secondary | ICD-10-CM

## 2021-12-05 DIAGNOSIS — T7800XA Anaphylactic reaction due to unspecified food, initial encounter: Secondary | ICD-10-CM

## 2021-12-05 DIAGNOSIS — J302 Other seasonal allergic rhinitis: Secondary | ICD-10-CM

## 2021-12-05 MED ORDER — AZELASTINE HCL 0.1 % NA SOLN: 2.0000 | Freq: Two times a day (BID) | NASAL | 5 refills | Status: DC

## 2021-12-05 MED ORDER — ALBUTEROL SULFATE HFA 108 (90 BASE) MCG/ACT IN AERS: INHALATION_SPRAY | RESPIRATORY_TRACT | 1 refills | Status: DC

## 2021-12-05 MED ORDER — FLUTICASONE PROPIONATE HFA 110 MCG/ACT IN AERO: 2.0000 | INHALATION_SPRAY | Freq: Two times a day (BID) | RESPIRATORY_TRACT | 5 refills | Status: DC

## 2021-12-05 MED ORDER — CARBINOXAMINE MALEATE 4 MG PO TABS: 4.0000 mg | ORAL_TABLET | Freq: Three times a day (TID) | ORAL | 3 refills | Status: DC | PRN

## 2021-12-05 NOTE — Patient Instructions (Addendum)
1. Moderate persistent asthma without complication ?- Lung testing looks good today.  ?- We are not going to make any medication changes at this time. ?- Daily controller medication(s): Flovent 180mg 2 puffs twice daily with spacer ?- Prior to physical activity: albuterol 2 puffs 10-15 minutes before physical activity. ?- Rescue medications: albuterol 4 puffs every 4-6 hours as needed ?- Changes during respiratory infections or worsening symptoms: Increase Flovent 110cg to 4 puffs twice daily for TWO WEEKS. ?- Asthma control goals:  ?* Full participation in all desired activities (may need albuterol before activity) ?* Albuterol use two time or less a week on average (not counting use with activity) ?* Cough interfering with sleep two time or less a month ?* Oral steroids no more than once a year ?* No hospitalizations ? ?2. Seasonal and perennial allergic rhinitis ?- Continue with Flonase 1 spray per nostril twice daily. ?- Continue with Zyrtec for the hives. ?- Add on carbinoxamine 4 mg every 8 hours as needed. ?- We will try to send in a 90-day supply to see if insurance will cover it. ? ?3. Chronic urticaria ?- Continue with Zyrtec 10 mg twice daily. ?- Continue with Pepcid 20 mg 1-2 times daily. ? ?4. Intrinsic atopic dermatitis ?- Continue with Dupixent every 2 weeks. ?- Continue with hydrocortisone compounded with Eucerin twice daily as needed. ? ?5. Pollen-food allergy ?- Continue to avoid all of your triggering foods. ? 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 ? ?7. Return in about 6 months (around 06/06/2022).  ? ? ?Please inform u[place]of any Emergency Department visits, hospitalizations, or changes in symptoms. Call u[place]efore going to the ED for breathing or allergy symptoms since we might be able to fit you in for a sick visit. Feel free to contact u[place]anytime with any questions, problems, or concerns. ? ?It was a pleasure to meet you today! ? ?Websites that have  reliable patient information: ?1. American Academy of Asthma, Allergy, and Immunology: www.aaaai.org ?2. Food Allergy Research and Education (FARE): foodallergy.org ?3. Mothers of Asthmatics: http://www.asthmacommunitynetwork.org ?4. [employer]of Allergy, Asthma, and Immunology: [email or web address].co.uk? ? ?COVID-19 Vaccine Information can be found at: [email or web address] questions related to vaccine distribution or appointments, please email [email or web address] or call [phone]  ? ?We realize that you might be concerned about having an allergic reaction to the COVID19 vaccines. To help with that concern, WE ARE OFFERING THE COVID19 VACCINES IN OUR OFFICE! Ask the front desk for dates!  ? ? ? ??Like? u[place]on Facebook and Instagram for our latest updates!  ?  ? ? ?A healthy democracy works best when ANew York Life Insuranceparticipate! Make sure you are registered to vote! If you have moved or changed any of your contact information, you will need to get this updated before voting! ? ?In some cases, you MAY be able to register to vote online: [email or web address]? ? ? ? ? ? ? ? ? ?

## 2021-12-05 NOTE — Progress Notes (Signed)
? ?FOLLOW UP ? ?Date of Service/Encounter:  12/05/21 ? ? ?Assessment:  ? ?Moderate persistent asthma, uncomplicated ? ?Seasonal and perennial allergic rhinitis ? ?Atopic dermatitis ? ?Chronic urticaria ? ?Pollen-food syndrome ? ?Food allergies ? ?Plan/Recommendations:  ? ?1. Moderate persistent asthma without complication ?- Lung testing looks good today.  ?- We are not going to make any medication changes at this time. ?- Daily controller medication(s): Flovent 169mg 2 puffs twice daily with spacer ?- Prior to physical activity: albuterol 2 puffs 10-15 minutes before physical activity. ?- Rescue medications: albuterol 4 puffs every 4-6 hours as needed ?- Changes during respiratory infections or worsening symptoms: Increase Flovent 110cg to 4 puffs twice daily for TWO WEEKS. ?- Asthma control goals:  ?* Full participation in all desired activities (may need albuterol before activity) ?* Albuterol use two time or less a week on average (not counting use with activity) ?* Cough interfering with sleep two time or less a month ?* Oral steroids no more than once a year ?* No hospitalizations ? ?2. Seasonal and perennial allergic rhinitis ?- Continue with Flonase 1 spray per nostril twice daily. ?- Continue with Zyrtec for the hives. ?- Add on carbinoxamine 4 mg every 8 hours as needed. ?- We will try to send in a 90-day supply to see if insurance will cover it. ? ?3. Chronic urticaria ?- Continue with Zyrtec 10 mg twice daily. ?- Continue with Pepcid 20 mg 1-2 times daily. ? ?4. Intrinsic atopic dermatitis ?- Continue with Dupixent every 2 weeks. ?- Continue with hydrocortisone compounded with Eucerin twice daily as needed. ? ?5. Pollen-food allergy ?- Continue to avoid all of your triggering foods. ? 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 ? ?7. Return in about 6 months (around 06/06/2022).  ? ? ? ?Subjective:  ? ?[name] a 51y.o. female presenting today for  follow up of  ?Chief Complaint  ?Patient presents with  ? Follow-up  ?  No issues right now. Some itching still.  ? ? ?[name] a history of the following: ?Patient Active Problem List  ? Diagnosis Date Noted  ? Moderate persistent asthma without complication 062/95/2841 ? Seasonal and perennial allergic rhinitis 03/15/2021  ? Seasonal allergic conjunctivitis 03/15/2021  ? Chronic urticaria 03/15/2021  ? Intrinsic atopic dermatitis 03/15/2021  ? Pollen-food allergy 03/15/2021  ? Allergy with anaphylaxis due to food 03/15/2021  ? Fibroid, uterine 03/21/2015  ? Hyperlipidemia 11/24/2012  ? Dyschromia 03/12/2012  ? Hypertension 03/12/2012  ? Asthma 03/05/2012  ? Seasonal allergic rhinitis 03/05/2012  ? ? ?History obtained from: chart review and patient. ? ?CTionais a 51y.o. female presenting for a follow up visit. She was last seen in July 2022.  At that time, she was continued on montelukast as well as Flovent and albuterol.  For her allergic rhinitis, she was started on ipratropium as needed as well as Flonase.  Her eczema was controlled with Dupixent every 2 weeks as well as hydrocortisone as needed.  She was placed on antihistamines to be tapered at home for her urticaria.  She continues to avoid fresh fruits as well as strawberries, bananas, and kiwi.  Continued avoidance of pistachio was recommended. ? ?Since last visit, she has done very well. ? ?Asthma/Respiratory Symptom History: She remains on two puffs of Flovent twice daily for her asthma. She has been doing well with this. She has not been needing her rescue inhaler much at  all. Zain's asthma has been well controlled. She has not required rescue medication, experienced nocturnal awakenings due to lower respiratory symptoms, nor have activities of daily living been limited. She has required no Emergency Department or Urgent Care visits for her asthma. She has required zero courses of systemic steroids for asthma exacerbations since the  last visit. ACT score today is 25, indicating excellent asthma symptom control.  ? ?Allergic Rhinitis Symptom History: She has been using her Flonase.  She was on the nasal ipratropium but this was not doing anything, therefore she stopped. She remains on the Flonase because she wanted to stay on something, but she continues to have a lot of rhinorrhea. She is on the antihistamines for her hives, but even this does not seem to interrupt the flow of mucous. She has not tried carbinoxamine but is open to doing so.  ? ?Food Allergy Symptom History: She continues to avoid pistachios. She also avoids pitted fruits as well as bananas and strawberries due to oral allergy syndrome. She was not always like this. She remembers eating fruit from trucks when she was very young, similar to an ice cream truck.  ? ?Skin Symptom History: She loves her Dupixent. She gives it to herself and it is painful. But the product is worth it.  She has been using hydrocortisone compounded with Eucerin to treat her flares.  ? ?Otherwise, there have been no changes to her past medical history, surgical history, family history, or social history. ? ? ? ?Review of Systems  ?Constitutional: Negative.  Negative for chills, fever, malaise/fatigue and weight loss.  ?HENT: Negative.  Negative for congestion, ear discharge and ear pain.   ?     Positive for rhinorrhea.  Positive for throat clearing.  ?Eyes:  Negative for pain, discharge and redness.  ?Respiratory:  Negative for cough, sputum production, shortness of breath and wheezing.   ?Cardiovascular: Negative.  Negative for chest pain and palpitations.  ?Gastrointestinal:  Negative for abdominal pain, diarrhea, heartburn, nausea and vomiting.  ?Skin:  Positive for itching. Negative for rash.  ?Neurological:  Negative for dizziness and headaches.  ?Endo/Heme/Allergies:  Negative for environmental allergies. Does not bruise/bleed easily.   ? ? ? ?Objective:  ? ?Blood pressure (!) 156/92, pulse (!)  104, temperature 97.6 ?F (36.4 ?C), temperature source Temporal, resp. rate 16, height 5' (1.524 m), weight 142 lb 6.4 oz (64.6 kg), SpO2 99 %. ?Body mass index is 27.81 kg/m?. ? ? ? ?Physical Exam ?Vitals reviewed.  ?Constitutional:   ?   Appearance: She is well-developed.  ?   Comments: Very pleasant.  Cooperative with the exam.   ?HENT:  ?   Head: Normocephalic and atraumatic.  ?   Right Ear: Tympanic membrane, ear canal and external ear normal.  ?   Left Ear: Tympanic membrane, ear canal and external ear normal.  ?   Nose: No nasal deformity, septal deviation, mucosal edema or rhinorrhea.  ?   Right Turbinates: Enlarged and swollen.  ?   Left Turbinates: Enlarged and swollen.  ?   Right Sinus: No maxillary sinus tenderness or frontal sinus tenderness.  ?   Left Sinus: No maxillary sinus tenderness or frontal sinus tenderness.  ?   Comments: Turbinates are erythematous.  There is quite a bit of postnasal drip. ?   Mouth/Throat:  ?   Mouth: Mucous membranes are not pale and not dry.  ?   Pharynx: Uvula midline.  ?Eyes:  ?   General: Lids are normal.  No allergic shiner.    ?   Right eye: No discharge.     ?   Left eye: No discharge.  ?   Conjunctiva/sclera: Conjunctivae normal.  ?   Right eye: Right conjunctiva is not injected. No chemosis. ?   Left eye: Left conjunctiva is not injected. No chemosis. ?   Pupils: Pupils are equal, round, and reactive to light.  ?Cardiovascular:  ?   Rate and Rhythm: Normal rate and regular rhythm.  ?   Heart sounds: Normal heart sounds.  ?Pulmonary:  ?   Effort: Pulmonary effort is normal. No tachypnea, accessory muscle usage or respiratory distress.  ?   Breath sounds: Normal breath sounds. No wheezing, rhonchi or rales.  ?   Comments: Moving air well in all lung fields.  No increased work of breathing.  Speaking in full sentences. ?Chest:  ?   Chest wall: No tenderness.  ?Lymphadenopathy:  ?   Cervical: No cervical adenopathy.  ?Skin: ?   General: Skin is warm.  ?   Capillary  Refill: Capillary refill takes less than 2 seconds.  ?   Coloration: Skin is not pale.  ?   Findings: No abrasion, erythema, petechiae or rash. Rash is not papular, urticarial or vesicular.  ?Neurological:  ?   Me

## 2021-12-06 NOTE — Addendum Note (Signed)
Addended by: Eloy End D on: 12/06/2021 04:49 PM ? ? Modules accepted: Orders ? ?

## 2022-03-10 ENCOUNTER — Other Ambulatory Visit: Payer: Self-pay | Admitting: Family Medicine

## 2022-05-13 ENCOUNTER — Other Ambulatory Visit: Payer: Self-pay | Admitting: Family Medicine

## 2022-05-13 DIAGNOSIS — L508 Other urticaria: Secondary | ICD-10-CM

## 2022-05-13 LAB — COLOGUARD: COLOGUARD: NEGATIVE

## 2022-06-05 ENCOUNTER — Ambulatory Visit: Payer: Medicaid Other | Admitting: Allergy & Immunology

## 2022-06-12 ENCOUNTER — Ambulatory Visit (INDEPENDENT_AMBULATORY_CARE_PROVIDER_SITE_OTHER): Payer: Medicaid Other | Admitting: Allergy & Immunology

## 2022-06-12 ENCOUNTER — Encounter: Payer: Self-pay | Admitting: Allergy & Immunology

## 2022-06-12 VITALS — BP 110/78 | HR 84 | Temp 97.8°F | Resp 16 | Ht 60.0 in | Wt 125.6 lb

## 2022-06-12 DIAGNOSIS — J4540 Moderate persistent asthma, uncomplicated: Secondary | ICD-10-CM | POA: Diagnosis not present

## 2022-06-12 DIAGNOSIS — L2084 Intrinsic (allergic) eczema: Secondary | ICD-10-CM

## 2022-06-12 DIAGNOSIS — L508 Other urticaria: Secondary | ICD-10-CM

## 2022-06-12 DIAGNOSIS — J3089 Other allergic rhinitis: Secondary | ICD-10-CM | POA: Diagnosis not present

## 2022-06-12 DIAGNOSIS — J302 Other seasonal allergic rhinitis: Secondary | ICD-10-CM

## 2022-06-12 MED ORDER — EPINEPHRINE 0.3 MG/0.3ML IJ SOAJ: INTRAMUSCULAR | 1 refills | Status: DC

## 2022-06-12 MED ORDER — ONDANSETRON 8 MG PO TBDP: 8.0000 mg | ORAL_TABLET | Freq: Three times a day (TID) | ORAL | 0 refills | Status: DC | PRN

## 2022-06-12 MED ORDER — CARBINOXAMINE MALEATE 4 MG PO TABS
4.0000 mg | ORAL_TABLET | Freq: Three times a day (TID) | ORAL | 3 refills | Status: DC | PRN
Start: 2022-06-12 — End: 2023-02-14

## 2022-06-12 MED ORDER — ONDANSETRON 8 MG PO TBDP: 8.0000 mg | ORAL_TABLET | Freq: Three times a day (TID) | ORAL | 0 refills | Status: AC | PRN

## 2022-06-12 MED ORDER — CARBINOXAMINE MALEATE 4 MG PO TABS
4.0000 mg | ORAL_TABLET | Freq: Three times a day (TID) | ORAL | 3 refills | Status: DC | PRN
Start: 2022-06-12 — End: 2022-06-12

## 2022-06-12 NOTE — Progress Notes (Signed)
   FOLLOW UP  Date of Service/Encounter:  06/12/22   Assessment:   Moderate persistent asthma, uncomplicated   Seasonal and perennial allergic rhinitis   Atopic dermatitis   Chronic urticaria   Pollen-food syndrome   Food allergies    Plan/Recommendations:    There are no Patient Instructions on file for this visit.   Subjective:   [name] is a 51 y.o. female presenting today for follow up of No chief complaint on file.   [name] has a history of the following: Patient Active Problem List   Diagnosis Date Noted  . Moderate persistent asthma without complication 17/40/8144  . Seasonal and perennial allergic rhinitis 03/15/2021  . Seasonal allergic conjunctivitis 03/15/2021  . Chronic urticaria 03/15/2021  . Intrinsic atopic dermatitis 03/15/2021  . Pollen-food allergy 03/15/2021  . 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 Fibroid, uterine 03/21/2015  . Hyperlipidemia 11/24/2012  . 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 Hypertension 03/12/2012  . Asthma 03/05/2012  . Seasonal allergic rhinitis 03/05/2012    History obtained from: chart review and {Persons; PED relatives w/patient:19415::"patient"}.  [name] is a 51 y.o. female presenting for {Blank single:19197::"a food challenge","a drug challenge","skin testing","a sick visit","an evaluation of ***","a follow up visit"}.  {Blank single:19197::"Asthma/Respiratory Symptom History: ***"," "}  {Blank single:19197::"Allergic Rhinitis Symptom History: ***"," "}  {Blank single:19197::"Food Allergy Symptom History: ***"," "}  {Blank single:19197::"Skin Symptom History: ***"," "}  {Blank single:19197::"GERD Symptom History: ***"," "}  Otherwise, there have been no changes to her past medical history, surgical history, family history, or social history.    ROS     Objective:   There were no vitals taken for this visit. There is no height or weight on file to calculate BMI.    Physical  Exam   Diagnostic studies:    Spirometry: results normal (FEV1: 2.48/120%, FVC: 3.18/126%, FEV1/FVC: 78%).    Spirometry consistent with normal pattern.  When no sprays or using which ones are related to her treatment given in clinic with {Blank single:19197::"significant improvement in FEV1 per ATS criteria","significant improvement in FVC per ATS criteria","significant improvement in FEV1 and FVC per ATS criteria","improvement in FEV1, but not significant per ATS criteria","improvement in FVC, but not significant per ATS criteria","improvement in FEV1 and FVC, but not significant per ATS criteria","no improvement"}.  Allergy Studies: {Blank single:19197::"none","labs sent instead"," "}    {Blank single:19197::"Allergy testing results were read and interpreted by myself, documented by clinical staff."," "}      Salvatore Marvel, MD  Allergy and [place] [facility]

## 2022-06-12 NOTE — Patient Instructions (Addendum)
1. Moderate persistent asthma without complication - Lung testing looks amazing today.  - We are not going to make any medication changes at this time. - Daily controller medication(s): Flovent 138mg 2 puffs twice daily with spacer - Prior to physical activity: albuterol 2 puffs 10-15 minutes before physical activity. - Rescue medications: albuterol 4 puffs every 4-6 hours as needed - Changes during respiratory infections or worsening symptoms: Increase Flovent 110cg to 4 puffs twice daily for TWO WEEKS. - Asthma control goals:  * Full participation in all desired activities (may need albuterol before activity) * Albuterol use two time or less a week on average (not counting use with activity) * Cough interfering with sleep two time or less a month * Oral steroids no more than once a year * No hospitalizations  2. Seasonal and perennial allergic rhinitis - Continue with Flonase 1 spray per nostril twice daily. - Continue with Zyrtec for the hives. - Start Ryaltris one spray per nostril twice daily (SAMPLE PROVIDED).  - Add on carbinoxamine 4 mg every 8 hours as needed. - Add on Zofran 8 mg oral dissolving tablets to use every 8 hours as needed.   3. Chronic urticaria - Continue with Zyrtec 10 mg twice daily. - Continue with Pepcid 20 mg 1-2 times daily.  4. Intrinsic atopic dermatitis - Continue with Dupixent every 2 weeks. - Continue with hydrocortisone compounded with Eucerin twice daily as needed.  5. Pollen-food allergy - Continue to avoid all of your triggering foods. - EpiPen refilled today.   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  7. Return in about 6 months (around 12/12/2022).    Please inform u[place]of any Emergency Department visits, hospitalizations, or changes in symptoms. Call u[place]abefore going to the ED for breathing or allergy symptoms since we might be able to fit you in for a sick visit. Feel free to contact u[place]anytime with  any questions, problems, or concerns.  It was a pleasure to meet you today!  Websites that have reliable patient information: 1. American Academy of Asthma, Allergy, and Immunology: www.aaaai.org 2. Food Allergy Research and Education (FARE): foodallergy.org 3. Mothers of Asthmatics: http://www.asthmacommunitynetwork.org 4. American College of Allergy, Asthma, and Immunology: www.acaai.org   COVID-19 Vaccine Information can be found at: [email or web address] questions related to vaccine distribution or appointments, please email [email or web address] or call [phone]   We realize that you might be concerned about having an allergic reaction to the COVID19 vaccines. To help with that concern, WE ARE OFFERING THE COVID19 VACCINES IN OUR OFFICE! Ask the front desk for dates!     "Like" u[place]on Facebook and Instagram for our latest updates!      A healthy democracy works best when ANew York Life Insuranceparticipate! Make sure you are registered to vote! If you have moved or changed any of your contact information, you will need to get this updated before voting!  In some cases, you MAY be able to register to vote online: [email or web address]

## 2022-06-14 ENCOUNTER — Encounter: Payer: Self-pay | Admitting: Allergy & Immunology

## 2022-06-14 ENCOUNTER — Telehealth: Payer: Self-pay

## 2022-06-14 MED ORDER — FLUTICASONE PROPIONATE HFA 110 MCG/ACT IN AERO: 2.0000 | INHALATION_SPRAY | Freq: Two times a day (BID) | RESPIRATORY_TRACT | 5 refills | Status: DC

## 2022-06-14 MED ORDER — HYDROCORTISONE 2.5 % EX LOTN: TOPICAL_LOTION | CUTANEOUS | 3 refills | Status: DC

## 2022-06-14 NOTE — Telephone Encounter (Signed)
PA request received from [facility] [place] Medicaid through [facility] for Carbinoxamine Maleate '4MG'$  tablets.  PA has been submitted and APPROVED from 06/14/2022-06/14/2023.  Key: [id number]

## 2022-06-19 ENCOUNTER — Other Ambulatory Visit: Payer: Self-pay | Admitting: Allergy & Immunology

## 2022-06-19 DIAGNOSIS — L2084 Intrinsic (allergic) eczema: Secondary | ICD-10-CM

## 2022-06-19 NOTE — Telephone Encounter (Signed)
Is it okay for cream instead of lotion

## 2022-06-21 ENCOUNTER — Other Ambulatory Visit: Payer: Self-pay | Admitting: *Deleted

## 2022-06-21 MED ORDER — HYDROCORTISONE 2.5 % EX CREA: TOPICAL_CREAM | Freq: Two times a day (BID) | CUTANEOUS | 5 refills | Status: DC | PRN

## 2022-09-06 ENCOUNTER — Telehealth: Payer: Self-pay

## 2022-09-06 NOTE — Telephone Encounter (Signed)
Patient called due to not being able to get her Dupixent injection. She was told by the pharmacy that her shipment was delayed, but would get to the [place] office on 09/06/22. We did not receive a shipment of the patient's medication. Patient would like to pickup a sample from the [place] office on 09/07/22. Please advise.     Pt. Phone [phone]

## 2022-09-07 NOTE — Telephone Encounter (Signed)
Per [facility] delay was fedex and should deliver today

## 2022-09-07 NOTE — Telephone Encounter (Signed)
Patient picked up sample on 09/07/22. It only had one injection in the box patient plans to get a new box sent out for her next injection.

## 2022-09-07 NOTE — Telephone Encounter (Signed)
She plans to call and get a new one shipped out due to the other one not making it today. She is picking up the sample today so she is not further late on her shots thank you

## 2022-09-10 NOTE — Telephone Encounter (Signed)
Patient called and stated that she is unable to get a new shipment until the other one is delivered. Patient stated that the pharmacy informed her that her Dupixent wouldn't be good and once it was delivered would need to be shipped back. Once patients Dupixent arrives please call the pharmacy at [phone] and inform them. They will then send a shipping label to have the [place] shipped back and once that is done they will ship patient her new box.

## 2022-09-10 NOTE — Telephone Encounter (Signed)
Patients Dupixent arrived this morning. I opened it and it was cold and ice back was still frozen. I reached out to the pharmacy per patient request and spoke with Beverlee Nims. I informed Beverlee Nims my observation of the medication and she agree that the [place] is stable. She said as long as the medication is cold and the ice pack is still frozen it is safe to use.  Called and left message for patient to call the office in regards to this matter.

## 2022-09-13 NOTE — Telephone Encounter (Signed)
I called the patient and left a detailed message for the patient to call the [place] office in regards to her Dupixent injection.     Pt. Phone [phone]

## 2022-09-17 NOTE — Telephone Encounter (Signed)
[name] I., Pharmacist/[facility] - [phone] - called in - DOB verified - requesting drop return verification/clarification on Dupixent pick up from our office.  [name] I., stated the Dupixent replacement has already been approved as well.  Pick schedule is for 09/19/22 - [employer] will provider package label.  [name] advised she will contact patient regarding Dupixent replace being mailed to her residence since she is a self administration.

## 2022-09-17 NOTE — Telephone Encounter (Signed)
Called and left a detailed voicemail advising to patient her DPR permission on file.

## 2022-10-10 NOTE — Telephone Encounter (Signed)
Received fax from [place] advising:   Patient has confirmed shipping address to our office - [facility] date: 10/16/22.  Forwarding message to Tammy as update.

## 2022-11-15 ENCOUNTER — Telehealth: Payer: Self-pay

## 2022-11-15 NOTE — Telephone Encounter (Signed)
Received Dupixent shipment [facility] delivery date from [place].  [facility] Delivery Date: 11/15/22  Forwarding message to Tammy as update only.

## 2023-02-01 ENCOUNTER — Telehealth: Payer: Self-pay | Admitting: Allergy & Immunology

## 2023-02-01 NOTE — Telephone Encounter (Signed)
Left message to schedule Dupixent reapproval.  

## 2023-02-13 NOTE — Patient Instructions (Incomplete)
Asthma Continue montelukast 10 mg once a day to prevent cough or wheeze Continue Flovent 110-2 puffs twice a day with spacer to help prevent cough and wheeze.  Continue albuterol 2 puffs every 4 hours as needed for cough, wheeze, tightness in chest, or shortness of breath. For asthma flare, increase Flovent 110 to 4 puffs twice a day for 2 weeks or until cough and wheeze free  Allergic rhinitis (dust mite, cats, dogs, grass, cockroach, molds, trees, weeds) Start ipratropium bromide nasal spray 0.03%- using 2 sprays in each nostril 2-3 times a day as needed for drainage. Start saline nasal rinses to help with drainage. Use this prior to any medicated nasal sprays. Continue cetirizine 10 mg once a day as needed for a runny nose or itch Start Flonase 2 sprays each nostril once a day as needed for stuffy nose. In the right nostril, point the applicator out toward the right ear. In the left nostril, point the applicator out toward the left ear  Allergic conjunctivitis Continue olopatadine 1 drop each eye once a day as needed for itchy watery eyes  Eczema Continue Dupixent injections once every 2 weeks Continue a daily moisturizing routine with Eucerin Continue hydrocortisone to red itchy areas twice a day as needed   Hives (urticaria) Take the least amount of medication hive remaining hive free Cetirizine (Zyrtec) 10mg  twice a day and famotidine (Pepcid) 20 mg twice a day. If no symptoms for 7-14 days then decrease to. Cetirizine (Zyrtec) 10mg  twice a day and famotidine (Pepcid) 20 mg once a day.  If no symptoms for 7-14 days then decrease to. Cetirizine (Zyrtec) 10mg  twice a day.  If no symptoms for 7-14 days then decrease to. Cetirizine (Zyrtec) 10mg  once a day.  May use Benadryl (diphenhydramine) as needed for breakthrough hives       If symptoms return, then step up dosage Keep a detailed symptom journal including foods eaten, contact with allergens, medications taken, weather changes.    Pollen food allergy syndrome Continue avoidance of fresh pitted fruits as well as strawberries, bananas, and kiwi's  Food allergy Avoid pistachio and pistachio pudding. In case of an allergic reaction, give Benadryl 4  teaspoonfuls every 4 hours, and if life-threatening symptoms occur, inject with EpiPen 0.3 mg.  Call the clinic if this treatment plan is not working well for you  Follow up in 3 months or sooner if needed.  Reducing Pollen Exposure The American Academy of Allergy, Asthma and Immunology suggests the following steps to reduce your exposure to pollen during allergy seasons. Do not hang sheets or clothing out to dry; pollen may collect on these items. Do not mow lawns or spend time around freshly cut grass; mowing stirs up pollen. Keep windows closed at night.  Keep car windows closed while driving. Minimize morning activities outdoors, a time when pollen counts are usually at their highest. Stay indoors as much as possible when pollen counts or humidity is high and on windy days when pollen tends to remain in the air longer. Use air conditioning when possible.  Many air conditioners have filters that trap the pollen spores. Use a HEPA room air filter to remove pollen form the indoor air you breathe.   Control of Dust Mite Allergen Dust mites play a major role in allergic asthma and rhinitis. They occur in environments with high humidity wherever human skin is found. Dust mites absorb humidity from the atmosphere (ie, they do not drink) and feed on organic matter (including shed human  and animal skin). Dust mites are a microscopic type of insect that you cannot see with the naked eye. High levels of dust mites have been detected from mattresses, pillows, carpets, upholstered furniture, bed covers, clothes, soft toys and any woven material. The principal allergen of the dust mite is found in its feces. A gram of dust may contain 1,000 mites and 250,000 fecal particles. Mite  antigen is easily measured in the air during house cleaning activities. Dust mites do not bite and do not cause harm to humans, other than by triggering allergies/asthma.  Ways to decrease your exposure to dust mites in your home:  1. Encase mattresses, box springs and pillows with a mite-impermeable barrier or cover  2. Wash sheets, blankets and drapes weekly in hot water (130 F) with detergent and dry them in a dryer on the hot setting.  3. Have the room cleaned frequently with a vacuum cleaner and a damp dust-mop. For carpeting or rugs, vacuuming with a vacuum cleaner equipped with a high-efficiency particulate air (HEPA) filter. The dust mite allergic individual should not be in a room which is being cleaned and should wait 1 hour after cleaning before going into the room.  4. Do not sleep on upholstered furniture (eg, couches).  5. If possible removing carpeting, upholstered furniture and drapery from the home is ideal. Horizontal blinds should be eliminated in the rooms where the person spends the most time (bedroom, study, television room). Washable vinyl, roller-type shades are optimal.  6. Remove all non-washable stuffed toys from the bedroom. Wash stuffed toys weekly like sheets and blankets above.  7. Reduce indoor humidity to less than 50%. Inexpensive humidity monitors can be purchased at most hardware stores. Do not use a humidifier as can make the problem worse and are not recommended.  Control of Dog or Cat Allergen Avoidance is the best way to manage a dog or cat allergy. If you have a dog or cat and are allergic to dog or cats, consider removing the dog or cat from the home. If you have a dog or cat but don't want to find it a new home, or if your family wants a pet even though someone in the household is allergic, here are some strategies that may help keep symptoms at bay:  Keep the pet out of your bedroom and restrict it to only a few rooms. Be advised that keeping the dog  or cat in only one room will not limit the allergens to that room. Don't pet, hug or kiss the dog or cat; if you do, wash your hands with soap and water. High-efficiency particulate air (HEPA) cleaners run continuously in a bedroom or living room can reduce allergen levels over time. Regular use of a high-efficiency vacuum cleaner or a central vacuum can reduce allergen levels. Giving your dog or cat a bath at least once a week can reduce airborne allergen.  Control of Cockroach Allergen  Cockroach allergen has been identified as an important cause of acute attacks of asthma, especially in urban settings.  There are fifty-five species of cockroach that exist in the [place], however only three, the [place], [place] species produce allergen that can affect patients with Asthma.  Allergens can be obtained from fecal particles, egg casings and secretions from cockroaches.    Remove food sources. Reduce access to water. Seal access and entry points. Spray runways with 0.5-1% Diazinon or Chlorpyrifos Blow boric acid power under stoves and refrigerator. Place bait stations (  hydramethylnon) at feeding sites.  Control of Mold Allergen Mold and fungi can grow on a variety of surfaces provided certain temperature and moisture conditions exist.  Outdoor molds grow on plants, decaying vegetation and soil.  The major outdoor mold, Alternaria and Cladosporium, are found in very high numbers during hot and dry conditions.  Generally, a late Summer - Fall peak is seen for common outdoor fungal spores.  Rain will temporarily lower outdoor mold spore count, but counts rise rapidly when the rainy period ends.  The most important indoor molds are Aspergillus and Penicillium.  Dark, humid and poorly ventilated basements are ideal sites for mold growth.  The next most common sites of mold growth are the bathroom and the kitchen.  Outdoor [employer] Use air conditioning and keep windows  closed Avoid exposure to decaying vegetation. Avoid leaf raking. Avoid grain handling. Consider wearing a face mask if working in moldy areas.  Indoor Mold Control Maintain humidity below 50%. Clean washable surfaces with 5% bleach solution. Remove sources e.g. Contaminated carpets.

## 2023-02-13 NOTE — Progress Notes (Signed)
[address] [place] [address] Dept: [phone]  FOLLOW UP NOTE  Patient ID: [name], female    DOB: October 16, 1970  Age: 52 y.o. MRN: [id number] Date of Office Visit: 02/14/2023  Assessment  Chief Complaint: No chief complaint on file.  HPI [name] is a 52 year old female who presents to the clinic for follow-up visit.  She was last seen in this clinic on 06/12/2022 by Dr. 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  Her last environmental allergy testing via lab was on 12/27/2016 and was very positive to dust mite and tree as well as as grass pollen, weed pollen, ragweed pollen, mold, dog, cat, and cockroach.   Drug Allergies:  Allergies  Allergen Reactions   Plum Pulp Itching and Swelling   Prunus Persica Itching and Swelling   Strawberry Extract Itching and Swelling   Aspirin Other (See Comments)    Fever; Pt can take ibuprofen without problems.   Fruit & Vegetable Daily [Nutritional Supplements] Other (See Comments)    ALL FRUIT WITH SEEDS- THROAT ITCHING   Methocarbamol Nausea Only and Other (See Comments)    sleepy   Morphine And Codeine Itching   Percocet [Oxycodone-Acetaminophen] Itching and Nausea Only    Pt can take plain Tylenol and Tylenol with codeine.   Shrimp [Shellfish Allergy] Itching    THROAT ITCHING   Simvastatin Other (See Comments)    Body aches   Methocarbamol    Morphine    Oxycodone-Acetaminophen    Pistachio Nut (Diagnostic) Other (See Comments)    As well as pistachio pudding   Saccharin Nausea Only    Physical Exam: There were no vitals taken for this visit.   Physical Exam  Diagnostics:    Assessment and Plan: No diagnosis found.  No orders of the defined types were placed in this encounter.   Patient Instructions  Asthma Continue montelukast 10 mg once a day to prevent cough or wheeze Continue Flovent 110-2 puffs twice a day with  spacer to help prevent cough and wheeze.  Continue albuterol 2 puffs every 4 hours as needed for cough, wheeze, tightness in chest, or shortness of breath. For asthma flare, increase Flovent 110 to 4 puffs twice a day for 2 weeks or until cough and wheeze free  Allergic rhinitis (dust mite, cats, dogs, grass, cockroach, molds, trees, weeds) Start ipratropium bromide nasal spray 0.03%- using 2 sprays in each nostril 2-3 times a day as needed for drainage. Start saline nasal rinses to help with drainage. Use this prior to any medicated nasal sprays. Continue cetirizine 10 mg once a day as needed for a runny nose or itch Start Flonase 2 sprays each nostril once a day as needed for stuffy nose. In the right nostril, point the applicator out toward the right ear. In the left nostril, point the applicator out toward the left ear  Allergic conjunctivitis Continue olopatadine 1 drop each eye once a day as needed for itchy watery eyes  Eczema Continue Dupixent injections once every 2 weeks Continue a daily moisturizing routine with Eucerin Continue hydrocortisone to red itchy areas twice a day as needed   Hives (urticaria) Take the least amount of medication hive remaining hive free Cetirizine (Zyrtec) 10mg  twice a day and famotidine (Pepcid) 20 mg twice a day. If no symptoms for 7-14 days then decrease to. Cetirizine (Zyrtec) 10mg  twice a day and famotidine (Pepcid) 20 mg once a day.  If no symptoms for 7-14 days then decrease to. Cetirizine (Zyrtec) 10mg  twice a day.  If no symptoms for 7-14 days then decrease to. Cetirizine (Zyrtec) 10mg  once a day.  May use Benadryl (diphenhydramine) as needed for breakthrough hives       If symptoms return, then step up dosage Keep a detailed symptom journal including foods eaten, contact with allergens, medications taken, weather changes.   Pollen food allergy syndrome Continue avoidance of fresh pitted fruits as well as strawberries, bananas, and  kiwi's  Food allergy Avoid pistachio and pistachio pudding. In case of an allergic reaction, give Benadryl 4  teaspoonfuls every 4 hours, and if life-threatening symptoms occur, inject with EpiPen 0.3 mg.  Call the clinic if this treatment plan is not working well for you  Follow up in 3 months or sooner if needed.  Reducing Pollen Exposure The American Academy of Allergy, Asthma and Immunology suggests the following steps to reduce your exposure to pollen during allergy seasons. Do not hang sheets or clothing out to dry; pollen may collect on these items. Do not mow lawns or spend time around freshly cut grass; mowing stirs up pollen. Keep windows closed at night.  Keep car windows closed while driving. Minimize morning activities outdoors, a time when pollen counts are usually at their highest. Stay indoors as much as possible when pollen counts or humidity is high and on windy days when pollen tends to remain in the air longer. Use air conditioning when possible.  Many air conditioners have filters that trap the pollen spores. Use a HEPA room air filter to remove pollen form the indoor air you breathe.   Control of Dust Mite Allergen Dust mites play a major role in allergic asthma and rhinitis. They occur in environments with high humidity wherever human skin is found. Dust mites absorb humidity from the atmosphere (ie, they do not drink) and feed on organic matter (including shed human and animal skin). Dust mites are a microscopic type of insect that you cannot see with the naked eye. High levels of dust mites have been detected from mattresses, pillows, carpets, upholstered furniture, bed covers, clothes, soft toys and any woven material. The principal allergen of the dust mite is found in its feces. A gram of dust may contain 1,000 mites and 250,000 fecal particles. Mite antigen is easily measured in the air during house cleaning activities. Dust mites do not bite and do not cause harm to  humans, other than by triggering allergies/asthma.  Ways to decrease your exposure to dust mites in your home:  1. Encase mattresses, box springs and pillows with a mite-impermeable barrier or cover  2. Wash sheets, blankets and drapes weekly in hot water (130 F) with detergent and dry them in a dryer on the hot setting.  3. Have the room cleaned frequently with a vacuum cleaner and a damp dust-mop. For carpeting or rugs, vacuuming with a vacuum cleaner equipped with a high-efficiency particulate air (HEPA) filter. The dust mite allergic individual should not be in a room which is being cleaned and should wait 1 hour after cleaning before going into the room.  4. Do not sleep on upholstered furniture (eg, couches).  5. If possible removing carpeting, upholstered furniture and drapery from the home is ideal. Horizontal blinds should be eliminated in the rooms where the person spends the most time (bedroom, study, television room). Washable vinyl, roller-type shades are optimal.  6. Remove all non-washable stuffed toys from the bedroom. Wash  stuffed toys weekly like sheets and blankets above.  7. Reduce indoor humidity to less than 50%. Inexpensive humidity monitors can be purchased at most hardware stores. Do not use a humidifier as can make the problem worse and are not recommended.  Control of Dog or Cat Allergen Avoidance is the best way to manage a dog or cat allergy. If you have a dog or cat and are allergic to dog or cats, consider removing the dog or cat from the home. If you have a dog or cat but don't want to find it a new home, or if your family wants a pet even though someone in the household is allergic, here are some strategies that may help keep symptoms at bay:  Keep the pet out of your bedroom and restrict it to only a few rooms. Be advised that keeping the dog or cat in only one room will not limit the allergens to that room. Don't pet, hug or kiss the dog or cat; if you do,  wash your hands with soap and water. High-efficiency particulate air (HEPA) cleaners run continuously in a bedroom or living room can reduce allergen levels over time. Regular use of a high-efficiency vacuum cleaner or a central vacuum can reduce allergen levels. Giving your dog or cat a bath at least once a week can reduce airborne allergen.  Control of Cockroach Allergen  Cockroach allergen has been identified as an important cause of acute attacks of asthma, especially in urban settings.  There are fifty-five species of cockroach that exist in the [place], however only three, the [place], [place] species produce allergen that can affect patients with Asthma.  Allergens can be obtained from fecal particles, egg casings and secretions from cockroaches.    Remove food sources. Reduce access to water. Seal access and entry points. Spray runways with 0.5-1% Diazinon or Chlorpyrifos Blow boric acid power under stoves and refrigerator. Place bait stations (hydramethylnon) at feeding sites.  Control of Mold Allergen Mold and fungi can grow on a variety of surfaces provided certain temperature and moisture conditions exist.  Outdoor molds grow on plants, decaying vegetation and soil.  The major outdoor mold, Alternaria and Cladosporium, are found in very high numbers during hot and dry conditions.  Generally, a late Summer - Fall peak is seen for common outdoor fungal spores.  Rain will temporarily lower outdoor mold spore count, but counts rise rapidly when the rainy period ends.  The most important indoor molds are Aspergillus and Penicillium.  Dark, humid and poorly ventilated basements are ideal sites for mold growth.  The next most common sites of mold growth are the bathroom and the kitchen.  Outdoor [employer] Use air conditioning and keep windows closed Avoid exposure to decaying vegetation. Avoid leaf raking. Avoid grain handling. Consider wearing a face mask if  working in moldy areas.  Indoor Mold Control Maintain humidity below 50%. Clean washable surfaces with 5% bleach solution. Remove sources e.g. Contaminated carpets.  No follow-ups on file.    Thank you for the opportunity to care for this patient.  Please do not hesitate to contact me with questions.  Thermon Leyland, FNP [facility]

## 2023-02-14 ENCOUNTER — Other Ambulatory Visit: Payer: Self-pay

## 2023-02-14 ENCOUNTER — Encounter: Payer: Self-pay | Admitting: Family Medicine

## 2023-02-14 ENCOUNTER — Ambulatory Visit (INDEPENDENT_AMBULATORY_CARE_PROVIDER_SITE_OTHER): Payer: Medicaid Other | Admitting: Family Medicine

## 2023-02-14 VITALS — BP 160/104 | HR 67 | Temp 97.9°F | Resp 18

## 2023-02-14 DIAGNOSIS — H1010 Acute atopic conjunctivitis, unspecified eye: Secondary | ICD-10-CM

## 2023-02-14 DIAGNOSIS — H1013 Acute atopic conjunctivitis, bilateral: Secondary | ICD-10-CM | POA: Diagnosis not present

## 2023-02-14 DIAGNOSIS — J4540 Moderate persistent asthma, uncomplicated: Secondary | ICD-10-CM | POA: Diagnosis not present

## 2023-02-14 DIAGNOSIS — J3089 Other allergic rhinitis: Secondary | ICD-10-CM | POA: Diagnosis not present

## 2023-02-14 DIAGNOSIS — T781XXD Other adverse food reactions, not elsewhere classified, subsequent encounter: Secondary | ICD-10-CM

## 2023-02-14 DIAGNOSIS — L508 Other urticaria: Secondary | ICD-10-CM | POA: Diagnosis not present

## 2023-02-14 DIAGNOSIS — L2084 Intrinsic (allergic) eczema: Secondary | ICD-10-CM

## 2023-02-14 DIAGNOSIS — T7800XA Anaphylactic reaction due to unspecified food, initial encounter: Secondary | ICD-10-CM

## 2023-02-14 DIAGNOSIS — J302 Other seasonal allergic rhinitis: Secondary | ICD-10-CM

## 2023-02-14 MED ORDER — VENTOLIN HFA 108 (90 BASE) MCG/ACT IN AERS: 2.0000 | INHALATION_SPRAY | RESPIRATORY_TRACT | 1 refills | Status: DC | PRN

## 2023-02-14 MED ORDER — CROMOLYN SODIUM 4 % OP SOLN: 1.0000 [drp] | Freq: Four times a day (QID) | OPHTHALMIC | 1 refills | Status: AC | PRN

## 2023-02-14 MED ORDER — HYDROCORTISONE 2.5 % EX CREA: TOPICAL_CREAM | Freq: Two times a day (BID) | CUTANEOUS | 1 refills | Status: AC | PRN

## 2023-02-14 MED ORDER — MONTELUKAST SODIUM 10 MG PO TABS: 10.0000 mg | ORAL_TABLET | Freq: Every day | ORAL | 1 refills | Status: AC

## 2023-02-14 MED ORDER — IPRATROPIUM BROMIDE 0.03 % NA SOLN: NASAL | 1 refills | Status: DC

## 2023-02-14 MED ORDER — FLUTICASONE PROPIONATE HFA 110 MCG/ACT IN AERO: 2.0000 | INHALATION_SPRAY | Freq: Two times a day (BID) | RESPIRATORY_TRACT | 1 refills | Status: DC

## 2023-02-14 MED ORDER — HYDROXYZINE HCL 10 MG PO TABS: ORAL_TABLET | ORAL | 1 refills | Status: DC

## 2023-02-14 MED ORDER — CETIRIZINE HCL 10 MG PO TABS: 10.0000 mg | ORAL_TABLET | Freq: Two times a day (BID) | ORAL | 1 refills | Status: DC | PRN

## 2023-03-04 ENCOUNTER — Telehealth: Payer: Self-pay | Admitting: Family Medicine

## 2023-03-04 NOTE — Telephone Encounter (Signed)
Is it ok to do 3 months on hydroxyzine?

## 2023-03-04 NOTE — Telephone Encounter (Signed)
Patient is requesting for prescription for hydroxyzine to be adjusted for a 3 month supply and twice a day.   Patient is also requesting the rest of her prescriptions be checked to make sure they were sent in for a 3 month supply..   Best contact number: [phone]  [facility] [address] [place] [address]

## 2023-03-05 MED ORDER — HYDROXYZINE HCL 10 MG PO TABS: ORAL_TABLET | ORAL | 1 refills | Status: DC

## 2023-03-05 NOTE — Telephone Encounter (Signed)
Yes please

## 2023-03-05 NOTE — Telephone Encounter (Signed)
Sent in 3 month supply to [facility]

## 2023-03-13 ENCOUNTER — Telehealth: Payer: Self-pay | Admitting: Family Medicine

## 2023-03-13 MED ORDER — DUPIXENT 300 MG/2ML ~~LOC~~ SOSY: PREFILLED_SYRINGE | SUBCUTANEOUS | 12 refills | Status: DC

## 2023-03-13 NOTE — Telephone Encounter (Signed)
RX sent

## 2023-03-13 NOTE — Telephone Encounter (Signed)
Patient requesting Dupixent prescription be sent to specialty pharmacy (patient unsure which specialty pharmacy but states our office knows) as soon as possible.

## 2023-03-15 NOTE — Telephone Encounter (Signed)
Patient called to follow up about her medication. Patient has been informed that the Rx has been sent.  We will call her about medication pickup once we know when it will be shipped to the office.

## 2023-04-05 NOTE — Telephone Encounter (Signed)
Patient picked up Dupixent injection today.

## 2023-04-19 NOTE — Telephone Encounter (Signed)
Picked up one Dupixent syringe today.

## 2023-07-05 ENCOUNTER — Telehealth: Payer: Self-pay | Admitting: Allergy & Immunology

## 2023-07-05 NOTE — Telephone Encounter (Signed)
Left voicemail to give the office a call back to schedule Dupixent reapproval appointment before 12/25.

## 2023-07-16 ENCOUNTER — Ambulatory Visit (INDEPENDENT_AMBULATORY_CARE_PROVIDER_SITE_OTHER): Payer: Medicaid Other | Admitting: Family Medicine

## 2023-07-16 ENCOUNTER — Encounter: Payer: Self-pay | Admitting: Family Medicine

## 2023-07-16 VITALS — BP 152/90 | HR 84 | Temp 96.3°F | Resp 12

## 2023-07-16 DIAGNOSIS — J3089 Other allergic rhinitis: Secondary | ICD-10-CM | POA: Diagnosis not present

## 2023-07-16 DIAGNOSIS — H1013 Acute atopic conjunctivitis, bilateral: Secondary | ICD-10-CM | POA: Diagnosis not present

## 2023-07-16 DIAGNOSIS — T7800XA Anaphylactic reaction due to unspecified food, initial encounter: Secondary | ICD-10-CM

## 2023-07-16 DIAGNOSIS — K219 Gastro-esophageal reflux disease without esophagitis: Secondary | ICD-10-CM

## 2023-07-16 DIAGNOSIS — J4540 Moderate persistent asthma, uncomplicated: Secondary | ICD-10-CM

## 2023-07-16 DIAGNOSIS — L2084 Intrinsic (allergic) eczema: Secondary | ICD-10-CM

## 2023-07-16 DIAGNOSIS — J302 Other seasonal allergic rhinitis: Secondary | ICD-10-CM

## 2023-07-16 DIAGNOSIS — L508 Other urticaria: Secondary | ICD-10-CM

## 2023-07-16 DIAGNOSIS — T781XXD Other adverse food reactions, not elsewhere classified, subsequent encounter: Secondary | ICD-10-CM | POA: Diagnosis not present

## 2023-07-16 DIAGNOSIS — H1010 Acute atopic conjunctivitis, unspecified eye: Secondary | ICD-10-CM

## 2023-07-16 NOTE — Patient Instructions (Addendum)
Asthma Continue montelukast 10 mg once a day to prevent cough or wheeze Continue Flovent 110-2 puffs twice a day with spacer to help prevent cough and wheeze.  Continue albuterol 2 puffs every 4 hours as needed for cough, wheeze, tightness in chest, or shortness of breath. For asthma flare, increase Flovent 110 to 4 puffs twice a day for 2 weeks or until cough and wheeze free  Allergic rhinitis (dust mite, cats, dogs, grass, cockroach, molds, trees, weeds) Continue ipratropium bromide nasal spray 0.03%- using 2 sprays in each nostril 2-3 times a day as needed for drainage. May use azelastine 2 sprays in each nostril once a day as needed for a runny nose Continue saline nasal rinses to help with drainage. Use this prior to any medicated nasal sprays. Continue cetirizine 10 mg once a day as needed for a runny nose or itch. Remember to rotate to a different antihistamine about every 3 months. Some examples of over the counter antihistamines include Zyrtec (cetirizine), Xyzal (levocetirizine), Allegra (fexofenadine), and Claritin (loratidine).  Continue Flonase 2 sprays each nostril once a day as needed for stuffy nose. In the right nostril, point the applicator out toward the right ear. In the left nostril, point the applicator out toward the left ear  Allergic conjunctivitis Continue cromolyn 1-2 drops in each eye up to 4 times a day as needed Continue a lubricating eye drop as needed  Eczema Continue Dupixent injections once every 2 weeks Continue a daily moisturizing routine with Eucerin Continue hydrocortisone to red itchy areas twice a day as needed  Continue hydroxyzine 1 or 2 tablets at bedtime to control nighttime itch  Hives (urticaria) Take the least amount of medication hive remaining hive free Cetirizine (Zyrtec) 10mg  twice a day and famotidine (Pepcid) 20 mg twice a day. If no symptoms for 7-14 days then decrease to. Cetirizine (Zyrtec) 10mg  twice a day and famotidine (Pepcid) 20  mg once a day.  If no symptoms for 7-14 days then decrease to. Cetirizine (Zyrtec) 10mg  twice a day.  If no symptoms for 7-14 days then decrease to. Cetirizine (Zyrtec) 10mg  once a day.  May use Benadryl (diphenhydramine) as needed for breakthrough hives       If symptoms return, then step up dosage Keep a detailed symptom journal including foods eaten, contact with allergens, medications taken, weather changes.   Reflux Continue dietary and lifestyle modifications as listed below Continue pantoprazole as previously prescribed  Pollen food allergy syndrome Continue avoidance of fresh pitted fruits as well as strawberries, bananas, and kiwi's  Food allergy Avoid pistachio and pistachio pudding. In case of an allergic reaction, give Benadryl 4  teaspoonfuls every 4 hours, and if life-threatening symptoms occur, inject with EpiPen 0.3 mg.  Your blood pressure was elevated at today's visit.  Follow-up with your primary care provider for further evaluation and treatment of your blood pressure  Call the clinic if this treatment plan is not working well for you  Follow up in 6 months or sooner if needed.  Reducing Pollen Exposure The American Academy of Allergy, Asthma and Immun[employer] suggests the following steps to reduce your exposure to pollen during allergy seasons. Do not hang sheets or clothing out to dry; pollen may collect on these items. Do not mow lawns or spend time around freshly cut grass; mowing stirs up pollen. Keep windows closed at night.  Keep car windows closed while driving. Minimize morning activities outdoors, a time when pollen counts are usually at their highest. Stay indoors  as much as possible when pollen counts or humidity is high and on windy days when pollen tends to remain in the air longer. Use air conditioning when possible.  Many air conditioners have filters that trap the pollen spores. Use a HEPA room air filter to remove pollen form the indoor air you  breathe.   Control of Dust Mite Allergen Dust mites play a major role in allergic asthma and rhinitis. They occur in environments with high humidity wherever human skin is found. Dust mites absorb humidity from the atmosphere (ie, they do not drink) and feed on organic matter (including shed human and animal skin). Dust mites are a microscopic type of insect that you cannot see with the naked eye. High levels of dust mites have been detected from mattresses, pillows, carpets, upholstered furniture, bed covers, clothes, soft toys and any woven material. The principal allergen of the dust mite is found in its feces. A gram of dust may contain 1,000 mites and 250,000 fecal particles. Mite antigen is easily measured in the air during house cleaning activities. Dust mites do not bite and do not cause harm to humans, other than by triggering allergies/asthma.  Ways to decrease your exposure to dust mites in your home:  1. Encase mattresses, box springs and pillows with a mite-impermeable barrier or cover  2. Wash sheets, blankets and drapes weekly in hot water (130 F) with detergent and dry them in a dryer on the hot setting.  3. Have the room cleaned frequently with a vacuum cleaner and a damp dust-mop. For carpeting or rugs, vacuuming with a vacuum cleaner equipped with a high-efficiency particulate air (HEPA) filter. The dust mite allergic individual should not be in a room which is being cleaned and should wait 1 hour after cleaning before going into the room.  4. Do not sleep on upholstered furniture (eg, couches).  5. If possible removing carpeting, upholstered furniture and drapery from the home is ideal. Horizontal blinds should be eliminated in the rooms where the person spends the most time (bedroom, study, television room). Washable vinyl, roller-type shades are optimal.  6. Remove all non-washable stuffed toys from the bedroom. Wash stuffed toys weekly like sheets and blankets above.  7.  Reduce indoor humidity to less than 50%. Inexpensive humidity monitors can be purchased at most hardware stores. Do not use a humidifier as can make the problem worse and are not recommended.  Control of Dog or Cat Allergen Avoidance is the best way to manage a dog or cat allergy. If you have a dog or cat and are allergic to dog or cats, consider removing the dog or cat from the home. If you have a dog or cat but don't want to find it a new home, or if your family wants a pet even though someone in the household is allergic, here are some strategies that may help keep symptoms at bay:  Keep the pet out of your bedroom and restrict it to only a few rooms. Be advised that keeping the dog or cat in only one room will not limit the allergens to that room. Don't pet, hug or kiss the dog or cat; if you do, wash your hands with soap and water. High-efficiency particulate air (HEPA) cleaners run continuously in a bedroom or living room can reduce allergen levels over time. Regular use of a high-efficiency vacuum cleaner or a central vacuum can reduce allergen levels. Giving your dog or cat a bath at least once a week  can reduce airborne allergen.  Control of Cockroach Allergen  Cockroach allergen has been identified as an important cause of acute attacks of asthma, especially in urban settings.  There are fifty-five species of cockroach that exist in the [place], however only three, the [place], [place] species produce allergen that can affect patients with Asthma.  Allergens can be obtained from fecal particles, egg casings and secretions from cockroaches.    Remove food sources. Reduce access to water. Seal access and entry points. Spray runways with 0.5-1% Diazinon or Chlorpyrifos Blow boric acid power under stoves and refrigerator. Place bait stations (hydramethylnon) at feeding sites.  Control of Mold Allergen Mold and fungi can grow on a variety of surfaces provided certain  temperature and moisture conditions exist.  Outdoor molds grow on plants, decaying vegetation and soil.  The major outdoor mold, Alternaria and Cladosporium, are found in very high numbers during hot and dry conditions.  Generally, a late Summer - Fall peak is seen for common outdoor fungal spores.  Rain will temporarily lower outdoor mold spore count, but counts rise rapidly when the rainy period ends.  The most important indoor molds are Aspergillus and Penicillium.  Dark, humid and poorly ventilated basements are ideal sites for mold growth.  The next most common sites of mold growth are the bathroom and the kitchen.  Outdoor [employer] Use air conditioning and keep windows closed Avoid exposure to decaying vegetation. Avoid leaf raking. Avoid grain handling. Consider wearing a face mask if working in moldy areas.  Indoor Mold Control Maintain humidity below 50%. Clean washable surfaces with 5% bleach solution. Remove sources e.g. Contaminated carpets.

## 2023-07-16 NOTE — Progress Notes (Signed)
[address] [place] [address] Dept: [phone]  FOLLOW UP NOTE  Patient ID: [name], female    DOB: [birth year]  Age: 52 y.o. MRN: [id number] Date of Office Visit: 07/16/2023  Assessment  Chief Complaint: Asthma and Allergies  HPI [name] is a 52 year old female who presents to the clinic for follow-up visit.  She was last seen in this clinic on 02/14/2023 by Thermon Leyland, FNP, for evaluation of asthma, allergic rhinitis, urticaria, and food allergy.  At today's visit, she reports her asthma has been moderately well-controlled with shortness of breath with activity as the main symptom.  She denies shortness of breath, cough, or wheeze at rest.  She denies nighttime asthma symptoms.  She continues montelukast 10 mg once a day, Flovent 110 twice a day with a spacer, and rarely needs to use albuterol for relief of symptoms.  Allergic rhinitis is reported as moderately well-controlled with clear rhinorrhea, occasional sneezing, and postnasal drainage with frequent throat clearing as the main symptoms.  She continues cetirizine 10 mg twice a day, Flonase daily, and ipratropium daily.  She is satisfied with the result of cetirizine at this time it is not interested in switching to a different antihistamine.  She denies symptoms of allergic conjunctivitis including red or itchy eyes and continues an allergy eyedrop as needed with relief of symptoms.  Atopic dermatitis is reported as moderately well-controlled with occasional red and itchy areas occurring in a flare in remission pattern.  She continues hydrocortisone as needed as well as hydroxyzine as needed.  She continues Dupixent injections 300 mg once every 2 weeks with no large or local reactions.  She reports a significant decrease in her symptoms of atopic dermatitis while continuing on Dupixent.  She denies any incidences of papular urticaria since her last visit to this clinic.  She continues cetirizine 10 mg twice a day  with relief of symptoms.  Reflux is reported as well-controlled with no symptoms including heartburn or vomiting.  She continues to take pantoprazole as previously prescribed with relief of symptoms.  She continues to avoid foods that bother her mouth including fruits with a pit like peaches, cherry, plum, etc.  She continues to avoid pistachios with no accidental ingestion or EpiPen use since her last visit to this clinic.  She continues to eat cashews with no adverse reaction.  EpiPen set is out of date and will be reordered at today's visit.  Her current medications are listed in the chart.  Drug Allergies:  Allergies  Allergen Reactions   Plum Pulp Itching and Swelling   Prunus Persica Itching and Swelling   Strawberry Extract Itching and Swelling   Aspirin Other (See Comments)    Fever; Pt can take ibuprofen without problems.   Fruit & Vegetable Daily [Nutritional Supplements] Other (See Comments)    ALL FRUIT WITH SEEDS- THROAT ITCHING   Methocarbamol Nausea Only and Other (See Comments)    sleepy   Morphine And Codeine Itching   Percocet [Oxycodone-Acetaminophen] Itching and Nausea Only    Pt can take plain Tylenol and Tylenol with codeine.   Shrimp [Shellfish Allergy] Itching    THROAT ITCHING   Simvastatin Other (See Comments)    Body aches   Banana Nausea Only   Methocarbamol    Morphine    Oxycodone-Acetaminophen    Pistachio Nut (Diagnostic) Other (See Comments)    As well as pistachio pudding   Saccharin Nausea Only    Physical Exam: BP Marland Kitchen)  152/90   Pulse 84   Temp (!) 96.3 F (35.7 C) (Temporal)   Resp 12   SpO2 99%    Physical Exam Vitals reviewed.  Constitutional:      Appearance: Normal appearance.  HENT:     Head: Normocephalic and atraumatic.     Right Ear: Tympanic membrane normal.     Left Ear: Tympanic membrane normal.     Nose:     Comments: Bilateral nares slightly erythematous with thin clear nasal drainage noted.  Pharynx normal.  Ears  normal.  Eyes normal.    Mouth/Throat:     Pharynx: Oropharynx is clear.  Eyes:     Conjunctiva/sclera: Conjunctivae normal.  Cardiovascular:     Rate and Rhythm: Normal rate and regular rhythm.     Heart sounds: Normal heart sounds. No murmur heard. Pulmonary:     Effort: Pulmonary effort is normal.     Breath sounds: Normal breath sounds.     Comments: Lungs clear to auscultation Musculoskeletal:        General: Normal range of motion.     Cervical back: Normal range of motion and neck supple.  Skin:    General: Skin is warm and dry.  Neurological:     Mental Status: She is alert and oriented to person, place, and time.  Psychiatric:        Mood and Affect: Mood normal.        Behavior: Behavior normal.        Thought Content: Thought content normal.        Judgment: Judgment normal.     Assessment and Plan: 1. Moderate persistent asthma without complication   2. Seasonal and perennial allergic rhinitis   3. Seasonal allergic conjunctivitis   4. Pollen-food allergy, subsequent encounter   5. Intrinsic atopic dermatitis   6. Chronic urticaria   7. Allergy with anaphylaxis due to food   8. Gastroesophageal reflux disease, unspecified whether esophagitis present     Meds ordered this encounter  Medications   EPINEPHrine 0.3 mg/0.3 mL IJ SOAJ injection    Sig: Use as directed, if administer call 911    Dispense:  2 each    Refill:  1    Dispense Generic Mylan or Teva.   hydrOXYzine (ATARAX) 10 MG tablet    Sig: Take 1-2 tablets at bedtime as needed for itch    Dispense:  180 tablet    Refill:  1   pantoprazole (PROTONIX) 40 MG tablet    Sig: Take 1 tablet (40 mg total) by mouth daily.    Dispense:  90 tablet    Refill:  1    Patient Instructions  Asthma Continue montelukast 10 mg once a day to prevent cough or wheeze Continue Flovent 110-2 puffs twice a day with spacer to help prevent cough and wheeze.  Continue albuterol 2 puffs every 4 hours as needed for  cough, wheeze, tightness in chest, or shortness of breath. For asthma flare, increase Flovent 110 to 4 puffs twice a day for 2 weeks or until cough and wheeze free  Allergic rhinitis (dust mite, cats, dogs, grass, cockroach, molds, trees, weeds) Continue ipratropium bromide nasal spray 0.03%- using 2 sprays in each nostril 2-3 times a day as needed for drainage. May use azelastine 2 sprays in each nostril once a day as needed for a runny nose Continue saline nasal rinses to help with drainage. Use this prior to any medicated nasal sprays. Continue cetirizine 10 mg once  a day as needed for a runny nose or itch. Remember to rotate to a different antihistamine about every 3 months. Some examples of over the counter antihistamines include Zyrtec (cetirizine), Xyzal (levocetirizine), Allegra (fexofenadine), and Claritin (loratidine).  Continue Flonase 2 sprays each nostril once a day as needed for stuffy nose. In the right nostril, point the applicator out toward the right ear. In the left nostril, point the applicator out toward the left ear  Allergic conjunctivitis Continue cromolyn 1-2 drops in each eye up to 4 times a day as needed Continue a lubricating eye drop as needed  Eczema Continue Dupixent injections once every 2 weeks Continue a daily moisturizing routine with Eucerin Continue hydrocortisone to red itchy areas twice a day as needed  Continue hydroxyzine 1 or 2 tablets at bedtime to control nighttime itch  Hives (urticaria) Take the least amount of medication hive remaining hive free Cetirizine (Zyrtec) 10mg  twice a day and famotidine (Pepcid) 20 mg twice a day. If no symptoms for 7-14 days then decrease to. Cetirizine (Zyrtec) 10mg  twice a day and famotidine (Pepcid) 20 mg once a day.  If no symptoms for 7-14 days then decrease to. Cetirizine (Zyrtec) 10mg  twice a day.  If no symptoms for 7-14 days then decrease to. Cetirizine (Zyrtec) 10mg  once a day.  May use Benadryl  (diphenhydramine) as needed for breakthrough hives       If symptoms return, then step up dosage Keep a detailed symptom journal including foods eaten, contact with allergens, medications taken, weather changes.   Reflux Continue dietary and lifestyle modifications as listed below Continue pantoprazole as previously prescribed  Pollen food allergy syndrome Continue avoidance of fresh pitted fruits as well as strawberries, bananas, and kiwi's  Food allergy Avoid pistachio and pistachio pudding. In case of an allergic reaction, give Benadryl 4  teaspoonfuls every 4 hours, and if life-threatening symptoms occur, inject with EpiPen 0.3 mg.  Your blood pressure was elevated at today's visit.  Follow-up with your primary care provider for further evaluation and treatment of your blood pressure  Call the clinic if this treatment plan is not working well for you  Follow up in 6 months or sooner if needed.   Return in about 6 months (around 01/13/2024), or if symptoms worsen or fail to improve.    Thank you for the opportunity to care for this patient.  Please do not hesitate to contact me with questions.  Thermon Leyland, FNP [facility]

## 2023-07-17 ENCOUNTER — Encounter: Payer: Self-pay | Admitting: Family Medicine

## 2023-07-17 DIAGNOSIS — K219 Gastro-esophageal reflux disease without esophagitis: Secondary | ICD-10-CM | POA: Insufficient documentation

## 2023-07-17 MED ORDER — EPINEPHRINE 0.3 MG/0.3ML IJ SOAJ: INTRAMUSCULAR | 1 refills | Status: DC

## 2023-07-17 MED ORDER — PANTOPRAZOLE SODIUM 40 MG PO TBEC: 40.0000 mg | DELAYED_RELEASE_TABLET | Freq: Every day | ORAL | 1 refills | Status: DC

## 2023-07-17 MED ORDER — HYDROXYZINE HCL 10 MG PO TABS: ORAL_TABLET | ORAL | 1 refills | Status: DC

## 2023-10-04 ENCOUNTER — Telehealth: Payer: Self-pay

## 2023-10-04 NOTE — Telephone Encounter (Signed)
Patient picked up Dupixent injection today (08/02/24).

## 2023-10-18 ENCOUNTER — Telehealth: Payer: Self-pay | Admitting: Allergy & Immunology

## 2023-10-18 NOTE — Telephone Encounter (Signed)
 Patient called reporting that she was at the door and no one was answering at the [facility] office.  Evidently, she comes to the office to get her Dupixent.  I informed her that the office was closed today because of a staff meeting that have been posted for at least 8 weeks if not longer.  She said that she has been knocking but no one has been answering.  I told her that the staff meeting was over 30 to 45 minutes ago and everybody is probably left.  She was very insistent on picking up her Dupixent.  She demanded that I try to call someone who might be there to see if they could just bring out her medication.  I did call Ferdie Ping and thankfully she was still there.  She went to provide her medication to her.  Evidently, she never gets the medication at our office she just picks up the medication and self administers at home.  I am not entirely sure why she cannot just have the medication shipped to her house.  Routing this message to Tammy to see if we can change that.  Malachi Bonds, MD [facility]

## 2023-10-21 NOTE — Telephone Encounter (Signed)
 I dont know why neither. I called and l/m for her to have it sent to home when she calls and reorders same

## 2024-01-14 ENCOUNTER — Ambulatory Visit: Payer: Medicaid Other | Admitting: Allergy & Immunology

## 2024-02-07 ENCOUNTER — Encounter: Payer: Self-pay | Admitting: Allergy

## 2024-02-07 ENCOUNTER — Ambulatory Visit: Admitting: Allergy

## 2024-02-07 ENCOUNTER — Other Ambulatory Visit: Payer: Self-pay

## 2024-02-07 VITALS — BP 142/92 | HR 87 | Temp 98.1°F | Resp 16 | Ht 60.0 in | Wt 145.1 lb

## 2024-02-07 DIAGNOSIS — L508 Other urticaria: Secondary | ICD-10-CM

## 2024-02-07 DIAGNOSIS — J3089 Other allergic rhinitis: Secondary | ICD-10-CM

## 2024-02-07 DIAGNOSIS — T7800XD Anaphylactic reaction due to unspecified food, subsequent encounter: Secondary | ICD-10-CM

## 2024-02-07 DIAGNOSIS — J4540 Moderate persistent asthma, uncomplicated: Secondary | ICD-10-CM | POA: Diagnosis not present

## 2024-02-07 DIAGNOSIS — H1010 Acute atopic conjunctivitis, unspecified eye: Secondary | ICD-10-CM

## 2024-02-07 DIAGNOSIS — J302 Other seasonal allergic rhinitis: Secondary | ICD-10-CM | POA: Diagnosis not present

## 2024-02-07 DIAGNOSIS — K219 Gastro-esophageal reflux disease without esophagitis: Secondary | ICD-10-CM

## 2024-02-07 DIAGNOSIS — H6121 Impacted cerumen, right ear: Secondary | ICD-10-CM

## 2024-02-07 DIAGNOSIS — R030 Elevated blood-pressure reading, without diagnosis of hypertension: Secondary | ICD-10-CM

## 2024-02-07 DIAGNOSIS — T781XXD Other adverse food reactions, not elsewhere classified, subsequent encounter: Secondary | ICD-10-CM

## 2024-02-07 DIAGNOSIS — L2089 Other atopic dermatitis: Secondary | ICD-10-CM

## 2024-02-07 MED ORDER — AEROCHAMBER MV MISC: 2 refills | Status: AC

## 2024-02-07 MED ORDER — EPINEPHRINE 0.3 MG/0.3ML IJ SOAJ: 0.3000 mg | INTRAMUSCULAR | 1 refills | Status: DC | PRN

## 2024-02-07 MED ORDER — CETIRIZINE HCL 10 MG PO TABS: 10.0000 mg | ORAL_TABLET | Freq: Two times a day (BID) | ORAL | 3 refills | Status: DC | PRN

## 2024-02-07 MED ORDER — HYDROXYZINE HCL 10 MG PO TABS: ORAL_TABLET | ORAL | 1 refills | Status: DC

## 2024-02-07 MED ORDER — CETIRIZINE HCL 10 MG PO TABS: 10.0000 mg | ORAL_TABLET | Freq: Every day | ORAL | 3 refills | Status: DC

## 2024-02-07 NOTE — Progress Notes (Signed)
 Follow Up Note  RE: [name] MRN: [id number] DOB: August 05, 1971 Date of Office Visit: 02/07/2024  Referring provider: Isabelle Maple* Primary care provider: Jearlean Mince, PA-C  Chief Complaint: Follow-up (No concerns)  History of Present Illness: I had the pleasure of seeing [name] for a follow up visit at the [facility] on 02/07/2024. She is a 53 y.o. female, who is being followed for asthma, allergic rhinitis, urticaria, and food allergy. Her previous allergy office visit was on 07/16/2023 with Marinus Sic, FNP. Today is a regular follow up visit.  Discussed the use of AI scribe software for clinical note transcription with the patient, who gave verbal consent to proceed.    She is currently using Dupixent  for her eczema, administered at home every two weeks, with no reported problems. This medication also aids in managing her asthma. She received a sample today as she was out of her regular supply and needs to be seen every six months to continue receiving the medication through insurance.  Her asthma is managed with Flovent , used two puffs twice a day, although not daily. She uses it about four to five days a week depending on symptom severity. A rescue inhaler is used rarely, about once a week during severe flare-ups, often triggered by allergies. She takes Zyrtec  twice a day for allergies and requests a 90-day refill. Flonase  nasal spray is used regularly, but azelastine  and ipratropium were discontinued due to ineffectiveness and nausea.  She uses creams and hydroxyzine  as needed for eczema. Hydroxyzine  is taken daily when available, and she requests a 90-day supply. Her hives are infrequent as long as she is careful and adheres to her medication regimen.  She manages heartburn with medication taken twice a day. She avoids pistachios due to severe reactions and has an EpiPen , which she needs to check for expiration and requests a refill just  in case.  She reports increased ear itching and wax buildup, particularly in one ear, which has been bothersome recently.     Assessment and Plan: [name] is a 53 y.o. female with: Moderate persistent asthma without complication Asthma well-controlled. Rare rescue inhaler use during allergy flare-ups. Today's breathing test was normal. Daily controller medication(s): continue Flovent  110mcg 2 puffs twice a day with spacer and rinse mouth afterwards. Continue Singulair  (montelukast ) 10mg  daily at night. During respiratory infections/flares:  Start Flovent  to 4 puffs twice a day for 1-2 weeks until your breathing symptoms return to baseline.  Pretreat with albuterol  2 puffs or albuterol  nebulizer.  If you need to use your albuterol  nebulizer machine back to back within 15-30 minutes with no relief then please go to the ER/urgent care for further evaluation.  May use albuterol  rescue inhaler 2 puffs or nebulizer every 4 to 6 hours as needed for shortness of breath, chest tightness, coughing, and wheezing. May use albuterol  rescue inhaler 2 puffs 5 to 15 minutes prior to strenuous physical activities. Monitor frequency of use - if you need to use it more than twice per week on a consistent basis let us  know.   Seasonal and perennial allergic rhinoconjunctivitis Uses Zyrtec  twice daily. Azelastine  and ipratropium ineffective and caused nausea. Continue environmental allergy measures. Use over the counter antihistamines such as Zyrtec  (cetirizine ), Claritin (loratadine), Allegra (fexofenadine), or Xyzal (levocetirizine) daily as needed. May take twice a day during allergy flares. May switch antihistamines every few months. Use Flonase  (fluticasone ) nasal spray 1-2 sprays per nostril once a day  as needed for nasal congestion.  Nasal saline spray (i.e., Simply Saline) or nasal saline lavage (i.e., NeilMed) is recommended as needed and prior to medicated nasal sprays.  Other atopic  dermatitis Eczema well-controlled with Dupixent . No recent side effects. Continue Dupixent  injections every 2 weeks at home. Will resubmit paperwork. Continue proper skin care. Use hydrocortisone  2.5% cream twice a day as needed for mild rash flares - okay to use on the face, neck, groin area. Do not use more than 1 week at a time. May take hydroxyzine  10mg  to 20mg  at bedtime as needed for itching. Wear 100% cotton gloves if needed instead of plastic gloves as I'm concerned about the moisture in the summer and potential for bacterial growth.  Chronic urticaria Infrequent hives managed with hydroxyzine . May use hydroxyzine  10mg  1-2 tablets at bedtime as needed.  Anaphylactic reaction due to food, subsequent encounter Continue to avoid pistachio. For mild symptoms you can take over the counter antihistamines (zyrtec  10mg  to 20mg ) and monitor symptoms closely.  If symptoms worsen or if you have severe symptoms including breathing issues, throat closure, significant swelling, whole body hives, severe diarrhea and vomiting, lightheadedness then use epinephrine  and seek immediate medical care afterwards. Emergency action plan in place.   Pollen-food allergy, subsequent encounter Continue avoidance of fresh pitted fruits as well as strawberries, bananas, and kiwi's.   Gastroesophageal reflux disease, unspecified whether esophagitis present Continue dietary and lifestyle modifications.  Continue pantoprazole  as previously prescribed.  Elevated blood pressure  Please follow up with PCP regarding this.  Patient didn't take her medications today yet for this.  Cerumen in right ear Use over the counter debrox 5-10 drops twice a day for up to 4 days in a row to soften the earwax.   Return in about 6 months (around 08/08/2024).  Meds ordered this encounter  Medications   DISCONTD: cetirizine  (ZYRTEC  ALLERGY) 10 MG tablet    Sig: Take 1 tablet (10 mg total) by mouth daily.    Dispense:  90  tablet    Refill:  3   cetirizine  (ZYRTEC  ALLERGY) 10 MG tablet    Sig: Take 1 tablet (10 mg total) by mouth 2 (two) times daily as needed for allergies.    Dispense:  180 tablet    Refill:  3   hydrOXYzine  (ATARAX ) 10 MG tablet    Sig: Take 1-2 tablets at bedtime as needed for itch    Dispense:  180 tablet    Refill:  1   EPINEPHrine  0.3 mg/0.3 mL IJ SOAJ injection    Sig: Inject 0.3 mg into the muscle as needed for anaphylaxis.    Dispense:  2 each    Refill:  1    May dispense generic/Mylan/Teva brand.   Spacer/Aero-Holding Chambers (AEROCHAMBER MV) inhaler    Sig: Use as instructed    Dispense:  1 each    Refill:  2   Lab Orders  No laboratory test(s) ordered today    Diagnostics: Spirometry:  Tracings reviewed. Her effort: Good reproducible efforts. FVC: 3.16L FEV1: 2.67L, 131% predicted FEV1/FVC ratio: 84% Interpretation: Spirometry consistent with normal pattern.  Please see scanned spirometry results for details.  Results discussed with patient/family.   Medication List:  Current Outpatient Medications  Medication Sig Dispense Refill   cromolyn  (OPTICROM ) 4 % ophthalmic solution Place 1-2 drops into both eyes 4 (four) times daily as needed. 30 mL 1   cyclobenzaprine (FLEXERIL) 10 MG tablet Take 10 mg by mouth at bedtime as needed.  0   DUPIXENT  300 MG/2ML prefilled syringe INJECT 1 SYRINGE (300 MG) UNDER THE SKIN EVERY 2 WEEKS 4 mL 12   EPINEPHrine  0.3 mg/0.3 mL IJ SOAJ injection Inject 0.3 mg into the muscle as needed for anaphylaxis. 2 each 1   EYSUVIS 0.25 % SUSP Apply to eye.     fluticasone  (FLONASE ) 50 MCG/ACT nasal spray Place into both nostrils daily.     fluticasone  (FLOVENT  HFA) 110 MCG/ACT inhaler Inhale 2 puffs into the lungs in the morning and at bedtime. 36 g 1   gabapentin (NEURONTIN) 600 MG tablet Take 600 mg by mouth 2 (two) times daily.     hydrocortisone  2.5 % cream Apply topically 2 (two) times daily as needed. 453.6 g 1   labetalol  (NORMODYNE) 200 MG tablet Take 1 tablet by mouth 2 (two) times daily.     lisinopril-hydrochlorothiazide (PRINZIDE,ZESTORETIC) 10-12.5 MG tablet Take 1 tablet by mouth daily.  0   meloxicam (MOBIC) 7.5 MG tablet meloxicam 7.5 mg tablet  TAKE 1 TAB BY MOUTH DAILY WITH FOOD     montelukast  (SINGULAIR ) 10 MG tablet Take 1 tablet (10 mg total) by mouth at bedtime. 90 tablet 1   ondansetron  (ZOFRAN -ODT) 8 MG disintegrating tablet Take 1 tablet (8 mg total) by mouth every 8 (eight) hours as needed for nausea or vomiting. 20 tablet 0   pantoprazole  (PROTONIX ) 40 MG tablet Take 1 tablet (40 mg total) by mouth daily. 90 tablet 1   RESTASIS 0.05 % ophthalmic emulsion 1 drop 2 (two) times daily.     Spacer/Aero-Holding Chambers (AEROCHAMBER MV) inhaler Use as instructed 1 each 2   VENTOLIN  HFA 108 (90 Base) MCG/ACT inhaler Inhale 2 puffs into the lungs every 4 (four) hours as needed for wheezing or shortness of breath. 54 g 1   ZETIA 10 MG tablet Take 10 mg by mouth daily.  1   cetirizine  (ZYRTEC  ALLERGY) 10 MG tablet Take 1 tablet (10 mg total) by mouth 2 (two) times daily as needed for allergies. 180 tablet 3   hydrOXYzine  (ATARAX ) 10 MG tablet Take 1-2 tablets at bedtime as needed for itch 180 tablet 1   Current Facility-Administered Medications  Medication Dose Route Frequency Provider Last Rate Last Admin   dupilumab  (DUPIXENT ) prefilled syringe 300 mg  300 mg Subcutaneous Q14 Days Kozlow, Eric J, MD   300 mg at 03/15/21 1627   Allergies: Allergies  Allergen Reactions   Plum Pulp Itching and Swelling   Prunus Persica Itching and Swelling   Strawberry Extract Itching and Swelling   Aspirin Other (See Comments)    Fever; Pt can take ibuprofen  without problems.   Fruit & Vegetable Daily [Nutritional Supplements] Other (See Comments)    ALL FRUIT WITH SEEDS- THROAT ITCHING   Methocarbamol Nausea Only and Other (See Comments)    sleepy   Morphine And Codeine  Itching   Percocet  [Oxycodone-Acetaminophen ] Itching and Nausea Only    Pt can take plain Tylenol  and Tylenol  with codeine .   Shrimp [Shellfish Allergy] Itching    THROAT ITCHING   Simvastatin Other (See Comments)    Body aches   Banana Nausea Only   Methocarbamol    Morphine    Oxycodone-Acetaminophen     Pistachio Nut (Diagnostic) Other (See Comments)    As well as pistachio pudding   Saccharin Nausea Only   I reviewed her past medical history, social history, family history, and environmental history and no significant changes have been reported from her previous visit.  Review of Systems  Constitutional:  Negative for appetite change, chills, fever and unexpected weight change.  HENT:  Negative for congestion and rhinorrhea.   Eyes:  Negative for itching.  Respiratory:  Negative for cough, chest tightness, shortness of breath and wheezing.   Cardiovascular:  Negative for chest pain.  Gastrointestinal:  Negative for abdominal pain.  Genitourinary:  Negative for difficulty urinating.  Skin:  Negative for rash.  Allergic/Immunologic: Positive for environmental allergies and food allergies.  Neurological:  Negative for headaches.    Objective: BP (!) 142/92 (BP Location: Left Arm, Patient Position: Sitting, Cuff Size: Normal)   Pulse 87   Temp 98.1 F (36.7 C)   Resp 16   Ht 5' (1.524 m)   Wt 145 lb 1.6 oz (65.8 kg)   SpO2 99%   BMI 28.34 kg/m  Body mass index is 28.34 kg/m. Physical Exam Vitals and nursing note reviewed.  Constitutional:      Appearance: Normal appearance. She is well-developed.  HENT:     Head: Normocephalic and atraumatic.     Right Ear: External ear normal.     Left Ear: Tympanic membrane and external ear normal.     Ears:     Comments: Cerumen in right ear canal.    Nose: Nose normal.     Mouth/Throat:     Mouth: Mucous membranes are moist.     Pharynx: Oropharynx is clear.   Eyes:     Conjunctiva/sclera: Conjunctivae normal.    Cardiovascular:     Rate  and Rhythm: Normal rate and regular rhythm.     Heart sounds: Normal heart sounds. No murmur heard.    No friction rub. No gallop.  Pulmonary:     Effort: Pulmonary effort is normal.     Breath sounds: Normal breath sounds. No wheezing, rhonchi or rales.   Musculoskeletal:     Cervical back: Neck supple.   Skin:    General: Skin is warm.     Findings: No rash.   Neurological:     Mental Status: She is alert and oriented to person, place, and time.   Psychiatric:        Behavior: Behavior normal.    Previous notes and tests were reviewed. The plan was reviewed with the patient/family, and all questions/concerned were addressed.  It was my pleasure to see [name] today and participate in her care. Please feel free to contact me with any questions or concerns.  Sincerely,  Eudelia Hero, DO Allergy & Immunology  [facility]  [facility] office: [phone] [facility] office: [phone]

## 2024-02-07 NOTE — Patient Instructions (Addendum)
 Asthma Today's breathing test was normal. Daily controller medication(s): continue Flovent  110mcg 2 puffs twice a day with spacer and rinse mouth afterwards. Continue Singulair  (montelukast ) 10mg  daily at night. During respiratory infections/flares:  Start Flovent  to 4 puffs twice a day for 1-2 weeks until your breathing symptoms return to baseline.  Pretreat with albuterol  2 puffs or albuterol  nebulizer.  If you need to use your albuterol  nebulizer machine back to back within 15-30 minutes with no relief then please go to the ER/urgent care for further evaluation.  May use albuterol  rescue inhaler 2 puffs or nebulizer every 4 to 6 hours as needed for shortness of breath, chest tightness, coughing, and wheezing. May use albuterol  rescue inhaler 2 puffs 5 to 15 minutes prior to strenuous physical activities. Monitor frequency of use - if you need to use it more than twice per week on a consistent basis let us  know.  Breathing control goals:  Full participation in all desired activities (may need albuterol  before activity) Albuterol  use two times or less a week on average (not counting use with activity) Cough interfering with sleep two times or less a month Oral steroids no more than once a year No hospitalizations   Allergic rhinitis Continue environmental allergy measures. Use over the counter antihistamines such as Zyrtec  (cetirizine ), Claritin (loratadine), Allegra (fexofenadine), or Xyzal (levocetirizine) daily as needed. May take twice a day during allergy flares. May switch antihistamines every few months. Use Flonase  (fluticasone ) nasal spray 1-2 sprays per nostril once a day as needed for nasal congestion.  Nasal saline spray (i.e., Simply Saline) or nasal saline lavage (i.e., NeilMed) is recommended as needed and prior to medicated nasal sprays.  Eczema Continue Dupixent  injections every 2 weeks at home. Will resubmit paperwork. Continue proper skin care. Use hydrocortisone   2.5% cream twice a day as needed for mild rash flares - okay to use on the face, neck, groin area. Do not use more than 1 week at a time. May take hydroxyzine  10mg  to 20mg  at bedtime as needed for itching. Wear 100% cotton gloves if needed.   Reflux Continue dietary and lifestyle modifications.  Continue pantoprazole  as previously prescribed.  Food  Continue to avoid pistachio. For mild symptoms you can take over the counter antihistamines (zyrtec  10mg  to 20mg ) and monitor symptoms closely.  If symptoms worsen or if you have severe symptoms including breathing issues, throat closure, significant swelling, whole body hives, severe diarrhea and vomiting, lightheadedness then use epinephrine  and seek immediate medical care afterwards. Emergency action plan in place.   Pollen food allergy syndrome Continue avoidance of fresh pitted fruits as well as strawberries, bananas, and kiwi's.   Ear wax Use over the counter debrox 5-10 drops twice a day for up to 4 days in a row to soften the earwax.   Elevated blood pressure  Blood pressure reading was high in our office today. Vitals:   02/07/24 1601  BP: (!) 142/92   Please follow up with PCP regarding this.    Follow up in 6 months or sooner if needed.   Skin care recommendations  Bath time: Always use lukewarm water. AVOID very hot or cold water. Keep bathing time to 5-10 minutes. Do NOT use bubble bath. Use a mild soap and use just enough to wash the dirty areas. Do NOT scrub skin vigorously.  After bathing, pat dry your skin with a towel. Do NOT rub or scrub the skin.  Moisturizers and prescriptions:  ALWAYS apply moisturizers immediately after bathing (  within 3 minutes). This helps to lock-in moisture. Use the moisturizer several times a day over the whole body. Good summer moisturizers include: Aveeno, CeraVe, Cetaphil. Good winter moisturizers include: Aquaphor, Vaseline, Cerave, Cetaphil, Eucerin, Vanicream. When using  moisturizers along with medications, the moisturizer should be applied about one hour after applying the medication to prevent diluting effect of the medication or moisturize around where you applied the medications. When not using medications, the moisturizer can be continued twice daily as maintenance.  Laundry and clothing: Avoid laundry products with added color or perfumes. Use unscented hypo-allergenic laundry products such as Tide free, Cheer free & gentle, and All free and clear.  If the skin still seems dry or sensitive, you can try double-rinsing the clothes. Avoid tight or scratchy clothing such as wool. Do not use fabric softeners or dyer sheets.

## 2024-02-17 ENCOUNTER — Telehealth: Payer: Self-pay | Admitting: Family Medicine

## 2024-02-17 NOTE — Telephone Encounter (Signed)
 Received onbase notification from [facility] for patient advising that a shipment of Dupixent  will be delivered to [address]. [employer] Delivery Date: 02/19/24

## 2024-02-25 ENCOUNTER — Telehealth: Payer: Self-pay | Admitting: Allergy & Immunology

## 2024-02-25 NOTE — Telephone Encounter (Signed)
 Alfonso from [employer] called regarding records request sent over via mail (06/13) and fax (07/01). Advised that our medical records person has been out, but that I would set follow up for her, she thanked. Confirmed can call back to [phone] or send fax information to [phone] (can email to [email or web address]

## 2024-03-02 ENCOUNTER — Other Ambulatory Visit: Payer: Self-pay | Admitting: Family Medicine

## 2024-05-01 ENCOUNTER — Ambulatory Visit

## 2024-05-01 DIAGNOSIS — J4540 Moderate persistent asthma, uncomplicated: Secondary | ICD-10-CM

## 2024-05-15 ENCOUNTER — Ambulatory Visit

## 2024-06-12 ENCOUNTER — Ambulatory Visit

## 2024-06-12 NOTE — Progress Notes (Signed)
 Patient came in today to pickup her injection. There is still one in the box

## 2024-06-25 ENCOUNTER — Ambulatory Visit

## 2024-06-25 NOTE — Progress Notes (Signed)
 Patient came in today to pick up her Dupixent  injection

## 2024-07-23 ENCOUNTER — Ambulatory Visit

## 2024-07-23 NOTE — Progress Notes (Signed)
 Patient came in today to pick up her injection.

## 2024-07-27 NOTE — Patient Instructions (Incomplete)
 Asthma- controlled Daily controller medication(s): continue Flovent  110mcg 2 puffs twice a day with spacer and rinse mouth afterwards. Continue Singulair  (montelukast ) 10mg  daily at night. During respiratory infections/flares:  Start Flovent  to 4 puffs twice a day for 1-2 weeks until your breathing symptoms return to baseline.  Pretreat with albuterol  2 puffs or albuterol  nebulizer.  If you need to use your albuterol  nebulizer machine back to back within 15-30 minutes with no relief then please go to the ER/urgent care for further evaluation.  May use albuterol  rescue inhaler 2 puffs or nebulizer every 4 to 6 hours as needed for shortness of breath, chest tightness, coughing, and wheezing. May use albuterol  rescue inhaler 2 puffs 5 to 15 minutes prior to strenuous physical activities. Monitor frequency of use - if you need to use it more than twice per week on a consistent basis let us  know.  Breathing control goals:  Full participation in all desired activities (may need albuterol  before activity) Albuterol  use two times or less a week on average (not counting use with activity) Cough interfering with sleep two times or less a month Oral steroids no more than once a year No hospitalizations   Allergic rhinitis -Discussed that we do not have testing for black mold or gas sewage. Recommend speaking with the apartment complex manager Continue environmental allergy measures. Use over the counter antihistamines such as Zyrtec  (cetirizine ), Claritin (loratadine), Allegra (fexofenadine), or Xyzal (levocetirizine) daily as needed. May take twice a day during allergy flares. May switch antihistamines every few months. Use Flonase  (fluticasone ) nasal spray 1-2 sprays per nostril once a day as needed for nasal congestion.  Nasal saline spray (i.e., Simply Saline) or nasal saline lavage (i.e., NeilMed) is recommended as needed and prior to medicated nasal sprays.  Eczema- better control with  Dupixent  Continue Dupixent  injections every 2 weeks at home. Continue proper skin care. Use hydrocortisone  2.5% cream twice a day as needed for mild rash flares - okay to use on the face, neck, groin area. Do not use more than 1 week at a time. May take hydroxyzine  10mg  to 20mg  at bedtime as needed for itching. Wear 100% cotton gloves if needed.   Reflux Continue dietary and lifestyle modifications.  Continue pantoprazole  as previously prescribed.  Food  allergy Continue to avoid pistachio. For mild symptoms you can take over the counter antihistamines (zyrtec  10mg  to 20mg ) and monitor symptoms closely.  If symptoms worsen or if you have severe symptoms including breathing issues, throat closure, significant swelling, whole body hives, severe diarrhea and vomiting, lightheadedness then use epinephrine  and seek immediate medical care afterwards. Emergency action plan in place.   Pollen food allergy syndrome Continue avoidance of fresh pitted fruits as well as strawberries, bananas, and kiwi's.  .   Follow up in 6 months or sooner if needed.   Skin care recommendations  Bath time: Always use lukewarm water. AVOID very hot or cold water. Keep bathing time to 5-10 minutes. Do NOT use bubble bath. Use a mild soap and use just enough to wash the dirty areas. Do NOT scrub skin vigorously.  After bathing, pat dry your skin with a towel. Do NOT rub or scrub the skin.  Moisturizers and prescriptions:  ALWAYS apply moisturizers immediately after bathing (within 3 minutes). This helps to lock-in moisture. Use the moisturizer several times a day over the whole body. Good summer moisturizers include: Aveeno, CeraVe, Cetaphil. Good winter moisturizers include: Aquaphor, Vaseline, Cerave, Cetaphil, Eucerin, Vanicream. When using moisturizers along  with medications, the moisturizer should be applied about one hour after applying the medication to prevent diluting effect of the medication or  moisturize around where you applied the medications. When not using medications, the moisturizer can be continued twice daily as maintenance.  Laundry and clothing: Avoid laundry products with added color or perfumes. Use unscented hypo-allergenic laundry products such as Tide free, Cheer free & gentle, and All free and clear.  If the skin still seems dry or sensitive, you can try double-rinsing the clothes. Avoid tight or scratchy clothing such as wool. Do not use fabric softeners or dyer sheets.

## 2024-07-28 ENCOUNTER — Other Ambulatory Visit: Payer: Self-pay

## 2024-07-28 ENCOUNTER — Ambulatory Visit: Admitting: Family

## 2024-07-28 ENCOUNTER — Encounter: Payer: Self-pay | Admitting: Family

## 2024-07-28 VITALS — BP 148/78 | HR 85 | Temp 97.7°F | Resp 20

## 2024-07-28 DIAGNOSIS — K219 Gastro-esophageal reflux disease without esophagitis: Secondary | ICD-10-CM | POA: Diagnosis not present

## 2024-07-28 DIAGNOSIS — J3089 Other allergic rhinitis: Secondary | ICD-10-CM | POA: Diagnosis not present

## 2024-07-28 DIAGNOSIS — H1010 Acute atopic conjunctivitis, unspecified eye: Secondary | ICD-10-CM | POA: Diagnosis not present

## 2024-07-28 DIAGNOSIS — T7800XD Anaphylactic reaction due to unspecified food, subsequent encounter: Secondary | ICD-10-CM

## 2024-07-28 DIAGNOSIS — J4540 Moderate persistent asthma, uncomplicated: Secondary | ICD-10-CM

## 2024-07-28 DIAGNOSIS — T7819XD Other adverse food reactions, not elsewhere classified, subsequent encounter: Secondary | ICD-10-CM

## 2024-07-28 DIAGNOSIS — L2089 Other atopic dermatitis: Secondary | ICD-10-CM | POA: Diagnosis not present

## 2024-07-28 DIAGNOSIS — J302 Other seasonal allergic rhinitis: Secondary | ICD-10-CM | POA: Diagnosis not present

## 2024-07-28 MED ORDER — FLUTICASONE PROPIONATE HFA 110 MCG/ACT IN AERO: 2.0000 | INHALATION_SPRAY | Freq: Two times a day (BID) | RESPIRATORY_TRACT | 1 refills | Status: AC

## 2024-07-28 MED ORDER — EPINEPHRINE 0.3 MG/0.3ML IJ SOAJ: 0.3000 mg | INTRAMUSCULAR | 1 refills | Status: AC | PRN

## 2024-07-28 MED ORDER — PANTOPRAZOLE SODIUM 40 MG PO TBEC: 40.0000 mg | DELAYED_RELEASE_TABLET | Freq: Every day | ORAL | 1 refills | Status: AC

## 2024-07-28 MED ORDER — HYDROXYZINE HCL 10 MG PO TABS: ORAL_TABLET | ORAL | 1 refills | Status: AC

## 2024-07-28 MED ORDER — CETIRIZINE HCL 10 MG PO TABS: 10.0000 mg | ORAL_TABLET | Freq: Two times a day (BID) | ORAL | 1 refills | Status: DC | PRN

## 2024-07-28 MED ORDER — VENTOLIN HFA 108 (90 BASE) MCG/ACT IN AERS: 2.0000 | INHALATION_SPRAY | RESPIRATORY_TRACT | 0 refills | Status: DC | PRN

## 2024-07-28 NOTE — Progress Notes (Signed)
 [address] [place] [address] Dept: [phone]  FOLLOW UP NOTE  Patient ID: [name], female    DOB: [birth year]  Age: 53 y.o. MRN: [id number] Date of Office Visit: 07/28/2024  Assessment  Chief Complaint: Follow-up (Pt asked if there's a way to be checked for black mold and sewage gases today.SABRA) and Medication Refill (Re-approval for mediation)  HPI [name] is a 53 year old female who presents today for follow-up of moderate persistent asthma without complication, seasonal and perennial allergic rhinoconjunctivitis, atopic dermatitis, chronic urticaria, anaphylactic reaction due to food, pollen food allergy, gastroesophageal reflux disease, elevated blood pressure, and cerumen in right ear.  She was last seen on February 07, 2024 by Dr. Luke.  She reports since her last office visit she has had a nerve block and an injection for her trigger points.  Moderate persistent asthma: She continues to take fluticasone  110 mcg 2 puffs twice a day with spacer, montelukast  10 mg at night, and albuterol  as needed.  She reports that she will have a cough sometimes.  She denies wheezing, tightness in chest, shortness of breath, and nocturnal awakenings due to breathing problems.  Since her last office visit she has not required any steroids for her asthma and has not made any trips to the emergency room or urgent care due to breathing problems.  She does not have to use her albuterol  that often.  She describes her asthma as pretty good.  She has not needed to increase her fluticasone  110 mcg to 4 puffs twice a day during respiratory illness/asthma flares.  Seasonal and perennial allergic rhinoconjunctivitis: She reports that she has had plumbing issues at her house that she feels is causing extra irritation.  She wonders if there is a test for sewage gases and black mold.  She reports clear rhinorrhea, nasal congestion, and postnasal drip.  She has not been treated for any sinus infections  since we last saw her.  She does take Zyrtec  twice a day and fluticasone  nasal spray as needed.  Atopic dermatitis: She continues to receive Dupixent  injections per protocol.  She denies any problems or reactions with her Dupixent  injections lately.  She reports sometime last year she had conjunctivitis, but that has not happened since.  Her asthma is doing pretty good.  She does have an itchy spot on her middle finger,  but her skin is a whole lot better on the Dupixent .  She has not had any skin infections since we last saw her.  She has hydrocortisone  2.5% cream to use as needed.  She does not like to wear cotton gloves and still wears plastic gloves.  She does not feel like the cotton gloves to protect her enough when she is out in the public.  Chronic urticaria: She reports that she will have little bit of hives on her arms now on them.  She does continue to take hydroxyzine  10 mg 1 to 2 tablets at bedtime.  Anaphylactic reaction due to food: She continues to avoid pistachio without any accidental ingestion or use of her epinephrine  autoinjector device.  Pollen food allergy: She continues to avoid fresh pitted fruits, strawberries, bananas, and kiwi's.  Gastroesophageal reflux disease: She reports that it is okay, but is starting to bother her more.  She notices that if she drinks lemonade it will bother her more and cause burning in her throat.  Discussed decreasing acidic foods as this can cause reflux to be worse. Also discussed other foods to  avoid.  She does take pantoprazole  daily.   Drug Allergies:  Allergies  Allergen Reactions   Pistachio Nut (Diagnostic) Other (See Comments)    As well as pistachio pudding   Plum Pulp Itching and Swelling   Prunus Persica Itching and Swelling   Strawberry Extract Itching and Swelling   Aspirin Other (See Comments)    Fever; Pt can take ibuprofen  without problems.   Fruit & Vegetable Daily [Nutritional Supplements] Other (See Comments)    ALL  FRUIT WITH SEEDS- THROAT ITCHING   Methocarbamol Nausea Only and Other (See Comments)    sleepy   Morphine And Codeine  Itching   Percocet [Oxycodone-Acetaminophen ] Itching and Nausea Only    Pt can take plain Tylenol  and Tylenol  with codeine .   Shrimp [Shellfish Allergy] Itching    THROAT ITCHING   Simvastatin Other (See Comments)    Body aches   Banana Nausea Only   Methocarbamol    Morphine    Oxycodone-Acetaminophen     Saccharin Nausea Only    Review of Systems: Negative except as per HPI   Physical Exam: BP (!) 148/78 (BP Location: Left Arm, Patient Position: Sitting, Cuff Size: Normal)   Pulse 85   Temp 97.7 F (36.5 C) (Temporal)   Resp 20   SpO2 97%    Physical Exam Constitutional:      Appearance: Normal appearance.  HENT:     Head: Normocephalic and atraumatic.     Comments: Pharynx normal, eyes normal, ears normal, nose: Bilateral lower turbinates mildly edematous and slightly erythematous with no drainage noted    Right Ear: Tympanic membrane, ear canal and external ear normal.     Left Ear: Tympanic membrane, ear canal and external ear normal.     Mouth/Throat:     Mouth: Mucous membranes are moist.     Pharynx: Oropharynx is clear.  Eyes:     Conjunctiva/sclera: Conjunctivae normal.  Cardiovascular:     Rate and Rhythm: Regular rhythm.     Heart sounds: Normal heart sounds.  Pulmonary:     Effort: Pulmonary effort is normal.     Breath sounds: Normal breath sounds.     Comments: Lungs clear to auscultation Musculoskeletal:     Cervical back: Neck supple.  Skin:    General: Skin is warm.  Neurological:     Mental Status: She is alert and oriented to person, place, and time.  Psychiatric:        Mood and Affect: Mood normal.        Behavior: Behavior normal.        Thought Content: Thought content normal.        Judgment: Judgment normal.     Diagnostics: FVC 3.29 L (132%), FEV1 2.65 L (131%), FEV1/FVC 0.81.  Spirometry indicates normal  spirometry.  Assessment and Plan: 1. Other atopic dermatitis   2. Moderate persistent asthma without complication   3. Seasonal and perennial allergic rhinoconjunctivitis   4. Anaphylactic reaction due to food, subsequent encounter   5. Pollen-food allergy, subsequent encounter   6. Gastroesophageal reflux disease, unspecified whether esophagitis present     Meds ordered this encounter  Medications   EPINEPHrine  0.3 mg/0.3 mL IJ SOAJ injection    Sig: Inject 0.3 mg into the muscle as needed for anaphylaxis.    Dispense:  2 each    Refill:  1    May dispense generic/Mylan/Teva brand.   pantoprazole  (PROTONIX ) 40 MG tablet    Sig: Take 1 tablet (40 mg total)  by mouth daily.    Dispense:  90 tablet    Refill:  1   hydrOXYzine  (ATARAX ) 10 MG tablet    Sig: Take 1-2 tablets at bedtime as needed for itch    Dispense:  180 tablet    Refill:  1   fluticasone  (FLOVENT  HFA) 110 MCG/ACT inhaler    Sig: Inhale 2 puffs into the lungs in the morning and at bedtime.    Dispense:  36 g    Refill:  1    Please dispense generic.   VENTOLIN  HFA 108 (90 Base) MCG/ACT inhaler    Sig: Inhale 2 puffs into the lungs every 4 (four) hours as needed for wheezing or shortness of breath.    Dispense:  54 g    Refill:  0    Requests 90 day supply   cetirizine  (ZYRTEC  ALLERGY) 10 MG tablet    Sig: Take 1 tablet (10 mg total) by mouth 2 (two) times daily as needed for allergies.    Dispense:  180 tablet    Refill:  1    Patient Instructions  Asthma- controlled Daily controller medication(s): continue Flovent  110mcg 2 puffs twice a day with spacer and rinse mouth afterwards. Continue Singulair  (montelukast ) 10mg  daily at night. During respiratory infections/flares:  Start Flovent  to 4 puffs twice a day for 1-2 weeks until your breathing symptoms return to baseline.  Pretreat with albuterol  2 puffs or albuterol  nebulizer.  If you need to use your albuterol  nebulizer machine back to back within  15-30 minutes with no relief then please go to the ER/urgent care for further evaluation.  May use albuterol  rescue inhaler 2 puffs or nebulizer every 4 to 6 hours as needed for shortness of breath, chest tightness, coughing, and wheezing. May use albuterol  rescue inhaler 2 puffs 5 to 15 minutes prior to strenuous physical activities. Monitor frequency of use - if you need to use it more than twice per week on a consistent basis let us  know.  Breathing control goals:  Full participation in all desired activities (may need albuterol  before activity) Albuterol  use two times or less a week on average (not counting use with activity) Cough interfering with sleep two times or less a month Oral steroids no more than once a year No hospitalizations   Allergic rhinitis -Discussed that we do not have testing for black mold or gas sewage. Recommend speaking with the apartment complex manager Continue environmental allergy measures. Use over the counter antihistamines such as Zyrtec  (cetirizine ), Claritin (loratadine), Allegra (fexofenadine), or Xyzal (levocetirizine) daily as needed. May take twice a day during allergy flares. May switch antihistamines every few months. Use Flonase  (fluticasone ) nasal spray 1-2 sprays per nostril once a day as needed for nasal congestion.  Nasal saline spray (i.e., Simply Saline) or nasal saline lavage (i.e., NeilMed) is recommended as needed and prior to medicated nasal sprays.  Eczema- better control with Dupixent  Continue Dupixent  injections every 2 weeks at home. Continue proper skin care. Use hydrocortisone  2.5% cream twice a day as needed for mild rash flares - okay to use on the face, neck, groin area. Do not use more than 1 week at a time. May take hydroxyzine  10mg  to 20mg  at bedtime as needed for itching. Wear 100% cotton gloves if needed.   Reflux Continue dietary and lifestyle modifications.  Continue pantoprazole  as previously prescribed.  Food   allergy Continue to avoid pistachio. For mild symptoms you can take over the counter antihistamines (zyrtec  10mg  to  20mg ) and monitor symptoms closely.  If symptoms worsen or if you have severe symptoms including breathing issues, throat closure, significant swelling, whole body hives, severe diarrhea and vomiting, lightheadedness then use epinephrine  and seek immediate medical care afterwards. Emergency action plan in place.   Pollen food allergy syndrome Continue avoidance of fresh pitted fruits as well as strawberries, bananas, and kiwi's.  .   Follow up in 6 months or sooner if needed.   Skin care recommendations  Bath time: Always use lukewarm water. AVOID very hot or cold water. Keep bathing time to 5-10 minutes. Do NOT use bubble bath. Use a mild soap and use just enough to wash the dirty areas. Do NOT scrub skin vigorously.  After bathing, pat dry your skin with a towel. Do NOT rub or scrub the skin.  Moisturizers and prescriptions:  ALWAYS apply moisturizers immediately after bathing (within 3 minutes). This helps to lock-in moisture. Use the moisturizer several times a day over the whole body. Good summer moisturizers include: Aveeno, CeraVe, Cetaphil. Good winter moisturizers include: Aquaphor, Vaseline, Cerave, Cetaphil, Eucerin, Vanicream. When using moisturizers along with medications, the moisturizer should be applied about one hour after applying the medication to prevent diluting effect of the medication or moisturize around where you applied the medications. When not using medications, the moisturizer can be continued twice daily as maintenance.  Laundry and clothing: Avoid laundry products with added color or perfumes. Use unscented hypo-allergenic laundry products such as Tide free, Cheer free & gentle, and All free and clear.  If the skin still seems dry or sensitive, you can try double-rinsing the clothes. Avoid tight or scratchy clothing such as wool. Do not  use fabric softeners or dyer sheets.  Return in about 6 months (around 01/26/2025).    Thank you for the opportunity to care for this patient.  Please do not hesitate to contact me with questions.  Wanda Craze, FNP [facility] 

## 2024-07-30 ENCOUNTER — Other Ambulatory Visit: Payer: Self-pay | Admitting: *Deleted

## 2024-08-03 MED ORDER — CETIRIZINE HCL 10 MG PO TABS: 10.0000 mg | ORAL_TABLET | Freq: Two times a day (BID) | ORAL | 1 refills | Status: AC | PRN

## 2024-08-03 MED ORDER — VENTOLIN HFA 108 (90 BASE) MCG/ACT IN AERS: 2.0000 | INHALATION_SPRAY | RESPIRATORY_TRACT | 0 refills | Status: AC | PRN

## 2024-08-03 NOTE — Addendum Note (Signed)
 Addended by: OTHA MADELIN HERO on: 08/03/2024 10:47 AM   Modules accepted: Orders

## 2024-08-21 ENCOUNTER — Encounter (HOSPITAL_BASED_OUTPATIENT_CLINIC_OR_DEPARTMENT_OTHER): Payer: Self-pay | Admitting: Emergency Medicine

## 2024-08-21 ENCOUNTER — Other Ambulatory Visit: Payer: Self-pay

## 2024-08-21 ENCOUNTER — Ambulatory Visit: Admitting: *Deleted

## 2024-08-21 ENCOUNTER — Emergency Department (HOSPITAL_BASED_OUTPATIENT_CLINIC_OR_DEPARTMENT_OTHER)
Admission: EM | Admit: 2024-08-21 | Discharge: 2024-08-21 | Disposition: A | Discharge status: Home / Self Care | Attending: Emergency Medicine | Admitting: Emergency Medicine

## 2024-08-21 DIAGNOSIS — Z203 Contact with and (suspected) exposure to rabies: Secondary | ICD-10-CM | POA: Insufficient documentation

## 2024-08-21 DIAGNOSIS — Z79899 Other long term (current) drug therapy: Secondary | ICD-10-CM | POA: Insufficient documentation

## 2024-08-21 DIAGNOSIS — W5501XA Bitten by cat, initial encounter: Secondary | ICD-10-CM | POA: Diagnosis not present

## 2024-08-21 DIAGNOSIS — I10 Essential (primary) hypertension: Secondary | ICD-10-CM | POA: Diagnosis not present

## 2024-08-21 DIAGNOSIS — Z2914 Encounter for prophylactic rabies immune globin: Secondary | ICD-10-CM | POA: Insufficient documentation

## 2024-08-21 DIAGNOSIS — W5503XA Scratched by cat, initial encounter: Secondary | ICD-10-CM

## 2024-08-21 DIAGNOSIS — S50812A Abrasion of left forearm, initial encounter: Secondary | ICD-10-CM | POA: Insufficient documentation

## 2024-08-21 DIAGNOSIS — L2089 Other atopic dermatitis: Secondary | ICD-10-CM

## 2024-08-21 DIAGNOSIS — Z23 Encounter for immunization: Secondary | ICD-10-CM | POA: Diagnosis not present

## 2024-08-21 DIAGNOSIS — S59912A Unspecified injury of left forearm, initial encounter: Secondary | ICD-10-CM | POA: Diagnosis present

## 2024-08-21 MED ORDER — RABIES IMMUNE GLOBULIN 300 UNIT/2ML IJ SOLN
20.0000 [IU]/kg | Freq: Once | INTRAMUSCULAR | Status: AC
  Administered 2024-08-21: 1200 [IU] via INTRAMUSCULAR
  Filled 2024-08-21: qty 8

## 2024-08-21 MED ORDER — RABIES VIRUS VACCINE, HDC IM SUSR
1.0000 mL | Freq: Once | INTRAMUSCULAR | Status: AC
  Administered 2024-08-21: 1 mL via INTRAMUSCULAR
  Filled 2024-08-21: qty 1

## 2024-08-21 MED ORDER — AMOXICILLIN-POT CLAVULANATE 875-125 MG PO TABS
1.0000 | ORAL_TABLET | Freq: Once | ORAL | Status: AC
  Administered 2024-08-21: 1 via ORAL
  Filled 2024-08-21: qty 1

## 2024-08-21 MED ORDER — AMOXICILLIN-POT CLAVULANATE 875-125 MG PO TABS: 1.0000 | ORAL_TABLET | Freq: Two times a day (BID) | ORAL | 0 refills | Status: AC

## 2024-08-21 MED ORDER — TETANUS-DIPHTH-ACELL PERTUSSIS 5-2-15.5 LF-MCG/0.5 IM SUSP
0.5000 mL | Freq: Once | INTRAMUSCULAR | Status: AC
  Administered 2024-08-21: 0.5 mL via INTRAMUSCULAR
  Filled 2024-08-21: qty 0.5

## 2024-08-21 NOTE — ED Provider Notes (Signed)
 " [place] EM[facility]ENC[facility] DEPARTMENT AT [facility] Provider Note   CSN: [id number] Arrival date & time: 08/21/24  1936     Patient presents with: Cat Scratch   [name] is a 54 y.o. female with history of hypertension, presents with concern for a cat bite/scratch earlier today and possible exposure to rabies.  She reports that she had a stray cat on her porch, and she went to help the cat, and the cat bit and scratched her left arm.  The cat ran off. Denies any redness around the scratch marks.  No fevers.  Unsure of when her last tetanus was.   HPI     Prior to Admission medications  Medication Sig Start Date End Date Taking? Authorizing Provider  amoxicillin -clavulanate (AUGMENTIN ) 875-125 MG tablet Take 1 tablet by mouth 2 (two) times daily for 5 days. 08/22/24 08/27/24 Yes Veta Palma, PA-C  cetirizine  (ZYRTEC  ALLERGY) 10 MG tablet Take 1 tablet (10 mg total) by mouth 2 (two) times daily as needed for allergies. 08/03/24   Cheryl Reusing, FNP  cromolyn  (OPTICROM ) 4 % ophthalmic solution Place 1-2 drops into both eyes 4 (four) times daily as needed. 02/14/23   Cari Arlean HERO, FNP  cyclobenzaprine (FLEXERIL) 10 MG tablet Take 10 mg by mouth at bedtime as needed. 11/20/16   [provider]  DUPIXENT  300 MG/2ML prefilled syringe INJECT THE CONTENTS OF 1 SYRINGE (300 MG) UNDER THE SKIN EVERY 2 WEEKS 03/02/24   Ambs, Arlean HERO, FNP  EPINEPHrine  0.3 mg/0.3 mL IJ SOAJ injection Inject 0.3 mg into the muscle as needed for anaphylaxis. 07/28/24   Cheryl Reusing, FNP  EYSUVIS 0.25 % SUSP Apply to eye. 09/20/22   [provider]  fluticasone  (FLONASE ) 50 MCG/ACT nasal spray Place into both nostrils daily.    [provider]  fluticasone  (FLOVENT  HFA) 110 MCG/ACT inhaler Inhale 2 puffs into the lungs in the morning and at bedtime. 07/28/24   Cheryl Reusing, FNP  gabapentin (NEURONTIN) 600 MG tablet Take 600 mg by mouth 2 (two) times daily. 01/26/20   [provider]  hydrocortisone  2.5 % cream Apply topically 2 (two) times daily as needed. 02/14/23   Cari Arlean HERO, FNP  hydrOXYzine  (ATARAX ) 10 MG tablet Take 1-2 tablets at bedtime as needed for itch 07/28/24   Cheryl Reusing, FNP  labetalol (NORMODYNE) 200 MG tablet Take 1 tablet by mouth 2 (two) times daily. 01/04/21   [provider]  lisinopril-hydrochlorothiazide (PRINZIDE,ZESTORETIC) 10-12.5 MG tablet Take 1 tablet by mouth daily. 10/30/16   [provider]  meloxicam (MOBIC) 7.5 MG tablet meloxicam 7.5 mg tablet  TAKE 1 TAB BY MOUTH DAILY WITH FOOD 03/04/17   [provider]  montelukast  (SINGULAIR ) 10 MG tablet Take 1 tablet (10 mg total) by mouth at bedtime. 02/14/23   Cari Arlean HERO, FNP  ondansetron  (ZOFRAN -ODT) 8 MG disintegrating tablet Take 1 tablet (8 mg total) by mouth every 8 (eight) hours as needed for nausea or vomiting. 06/12/22   Iva Marty Saltness, MD  pantoprazole  (PROTONIX ) 40 MG tablet Take 1 tablet (40 mg total) by mouth daily. 07/28/24   Cheryl Reusing, FNP  RESTASIS 0.05 % ophthalmic emulsion 1 drop 2 (two) times daily. 09/20/22   [provider]  Spacer/Aero-Holding Chambers (AEROCHAMBER MV) inhaler Use as instructed 02/07/24   Luke Orlan HERO, DO  VENTOLIN  HFA 108 (90 Base) MCG/ACT inhaler Inhale 2 puffs into the lungs every 4 (four) hours as needed for wheezing or shortness of  breath. 08/03/24   Cheryl Reusing, FNP  ZETIA 10 MG tablet Take 10 mg by mouth daily. 07/01/17   [provider]    Allergies: Pistachio nut (diagnostic), Plum pulp, Prunus persica, Strawberry extract, Aspirin, Fruit & vegetable daily [nutritional supplements], Methocarbamol, Morphine and codeine , Percocet [oxycodone-acetaminophen ], Shrimp [shellfish allergy], Simvastatin, Banana, Methocarbamol, Morphine, Oxycodone-acetaminophen , and Saccharin    Review of Systems  Skin:  Positive for wound.    Updated Vital Signs BP (!) 186/93   Pulse 72   Temp 98 F  (36.7 C) (Oral)   Resp 15   Ht 5' (1.524 m)   Wt 61.2 kg   SpO2 100%   BMI 26.37 kg/m   Physical Exam Vitals and nursing note reviewed.  Constitutional:      Appearance: Normal appearance.  HENT:     Head: Atraumatic.  Cardiovascular:     Rate and Rhythm: Normal rate and regular rhythm.  Pulmonary:     Effort: Pulmonary effort is normal.  Skin:    Comments: 3 cm superficial cat scratch to the patient's left forearm.  2 superficial bite marks also noted to the posterior aspect of patient's left forearm.  No active bleeding.  No surrounding erythema, no purulence.  No red streaking  Neurological:     General: No focal deficit present.     Mental Status: She is alert.  Psychiatric:        Mood and Affect: Mood normal.        Behavior: Behavior normal.       (all labs ordered are listed, but only abnormal results are displayed) Labs Reviewed - No data to display  EKG: None  Radiology: No results found.   Procedures   Medications Ordered in the ED  rabies immune globulin  (HYPERRAB) injection 1,200 Units (1,200 Units Intramuscular Given 08/21/24 2200)  rabies vaccine , human diploid (IMOVAX) injection 1 mL (1 mL Intramuscular Given 08/21/24 2156)  Tdap (ADACEL ) injection 0.5 mL (0.5 mLs Intramuscular Given 08/21/24 2147)  amoxicillin -clavulanate (AUGMENTIN ) 875-125 MG per tablet 1 tablet (1 tablet Oral Given 08/21/24 2147)                                    Medical Decision Making Risk Prescription drug management.     Differential diagnosis includes but is not limited to rabies exposure, tetanus exposure, cellulitis  ED Course:  Upon initial evaluation, patient is very well-appearing.  Has a superficial cat scratch and bite mark to the left forearm.  There is no surrounding erythema or edema.  No red streaking.  The bite marks were irrigated well with sterile saline.  Will cover prophylactically for infection with Augmentin .  Given cat is a stray, will cover for  rabies exposure with first dose of the rabies vaccine  and rabies immunoglobulin here today.  Will also update patient's Tdap.  Patient stable and appropriate for discharge home   Medications Given: Tdap Rabies immunoglobulin Rabies vaccine  Augmentin    Impression: Cat scratch Possible rabies exposure  Disposition:  The patient was discharged home with instructions to return to the ER or follow-up with PCP on days 3, 7, and 14 for the remainder of the rabies vaccine  series.  Take course of Augmentin  as prescribed.  I discussed with patient that her blood pressure was elevated today upon arrival, and she needs to follow-up with her PCP within the next month regarding further blood pressure control. Return precautions given  and patient verbalized understanding   This chart was dictated using voice recognition software, Dragon. Despite the best efforts of this provider to proofread and correct errors, errors may still occur which can change documentation meaning.       Final diagnoses:  Cat scratch    ED Discharge Orders          Ordered    amoxicillin -clavulanate (AUGMENTIN ) 875-125 MG tablet  2 times daily        08/21/24 2212               Veta Palma, PA-C 08/22/24 1058  "

## 2024-08-21 NOTE — ED Triage Notes (Signed)
 Patient reports being scratched by a stray cat on her left forearm this evening.  Patient did wash it off right afterwards.  Patient has small, superficial scratch to right left forearm.  Unknown TDAP.

## 2024-08-21 NOTE — Progress Notes (Signed)
 Picked up one dose of her Dupixent .

## 2024-08-21 NOTE — Discharge Instructions (Addendum)
 You have been given the first dose of the rabies vaccine series here today.  Please return on 08/25/2023, 08/29/2023, and 09/05/2023 for the remainder of your vaccine series. You can also notify your PCP that you need these shots and they can order them for you   Your tetanus has been updated today  In order to prevent infection from your cat scratch, you have been prescribed an antibiotic called Augmentin. Please take this twice daily for the next 5 days as prescribed starting tomorrow morning.   Please return to the emergency room if you develop spreading redness around your cat scratch sites, red streaking up your arm, any fevers at home, any other new or concerning symptoms

## 2024-08-24 ENCOUNTER — Ambulatory Visit (INDEPENDENT_AMBULATORY_CARE_PROVIDER_SITE_OTHER)

## 2024-08-24 ENCOUNTER — Ambulatory Visit
Admission: RE | Admit: 2024-08-24 | Discharge: 2024-08-24 | Disposition: A | Discharge status: Home / Self Care | Payer: Self-pay | Source: Ambulatory Visit | Attending: Family Medicine | Admitting: Family Medicine

## 2024-08-24 VITALS — BP 142/89 | HR 67 | Temp 98.5°F | Resp 16 | Ht 60.0 in | Wt 140.0 lb

## 2024-08-24 DIAGNOSIS — W5503XA Scratched by cat, initial encounter: Secondary | ICD-10-CM | POA: Diagnosis not present

## 2024-08-24 DIAGNOSIS — M79645 Pain in left finger(s): Secondary | ICD-10-CM

## 2024-08-24 DIAGNOSIS — Z23 Encounter for immunization: Secondary | ICD-10-CM | POA: Diagnosis not present

## 2024-08-24 MED ORDER — RABIES VIRUS VACCINE, HDC IM SUSR
1.0000 mL | Freq: Once | INTRAMUSCULAR | Status: AC
  Administered 2024-08-24: 1 mL via INTRAMUSCULAR

## 2024-08-24 NOTE — ED Triage Notes (Signed)
 Pt returns for her 3rd day rabies vaccine 

## 2024-08-24 NOTE — ED Notes (Signed)
 Pt also st's she needs her left index finger looked at because she hurt it when she fell

## 2024-08-24 NOTE — ED Provider Notes (Addendum)
 " [facility]    CSN: [id number] Arrival date & time: 08/24/24  1854      History   Chief Complaint Chief Complaint  Patient presents with   Follow-up    Time for second round of injections for rabies protection. First round done at emergency room on Friday. - Entered by patient    HPI [name] is a 54 y.o. female presents for rabies vaccine .  Was seen in the emergency room on 1/2 for cat scratch to left forearm.  She was started on the rabies series and Augmentin .  She denies any fevers chills.  In addition she states during the cat bite she fell and hurt her left index finger.  States she has some pain and swelling to the mid aspect of the finger.  No numbness tingling or bruising.  This was not addressed at her ER visit.  No other concerns.  HPI  Past Medical History:  Diagnosis Date   Arthritis    degenerative disc disease-work related injury   Asthma    controlled-last used rescue inhaler 3 months ago   Eczema    Fibroids    GERD (gastroesophageal reflux disease)    zantac /pepcid  prn   Headache(784.0)    High cholesterol    Hypertension    Pre-eclampsia    Pre-eclampsia    Urticaria     Patient Active Problem List   Diagnosis Date Noted   Gastroesophageal reflux disease 07/17/2023   Moderate persistent asthma without complication 03/15/2021   Seasonal and perennial allergic rhinitis 03/15/2021   Seasonal allergic conjunctivitis 03/15/2021   Chronic urticaria 03/15/2021   Intrinsic atopic dermatitis 03/15/2021   Pollen-food allergy 03/15/2021   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 Authorizing Provider  amoxicillin -clavulanate (AUGMENTIN ) 875-125 MG tablet Take 1 tablet by mouth 2 (two) times daily for 5 days. 08/22/24 08/27/24  Veta Palma, PA-C  cetirizine  (ZYRTEC  ALLERGY) 10 MG tablet Take 1 tablet (10 mg total) by mouth 2 (two) times daily as needed for allergies. 08/03/24   Cheryl Reusing, FNP  cromolyn  (OPTICROM ) 4 % ophthalmic solution Place 1-2 drops into both eyes 4 (four) times daily as needed. 02/14/23   Cari Arlean HERO, FNP  cyclobenzaprine (FLEXERIL) 10 MG tablet Take 10 mg by mouth at bedtime as needed. 11/20/16   [provider]  DUPIXENT  300 MG/2ML prefilled syringe INJECT THE CONTENTS OF 1 SYRINGE (300 MG) UNDER THE SKIN EVERY 2 WEEKS 03/02/24   Ambs, Arlean HERO, FNP  EPINEPHrine  0.3 mg/0.3 mL  IJ SOAJ injection Inject 0.3 mg into the muscle as needed for anaphylaxis. 07/28/24   Cheryl Reusing, FNP  EYSUVIS 0.25 % SUSP Apply to eye. 09/20/22   [provider]  fluticasone  (FLONASE ) 50 MCG/ACT nasal spray Place into both nostrils daily.    [provider]  fluticasone  (FLOVENT  HFA) 110 MCG/ACT inhaler Inhale 2 puffs into the lungs in the morning and at bedtime. 07/28/24   Cheryl Reusing, FNP  gabapentin (NEURONTIN) 600 MG tablet Take 600 mg by mouth 2 (two) times daily. 01/26/20   [provider]  hydrocortisone  2.5 % cream Apply topically 2 (two) times daily as needed. 02/14/23   Cari Arlean HERO, FNP  hydrOXYzine  (ATARAX ) 10 MG tablet Take 1-2 tablets at bedtime as needed for itch 07/28/24   Cheryl Reusing, FNP  labetalol (NORMODYNE) 200 MG tablet Take 1 tablet by mouth 2 (two) times daily. 01/04/21   [provider]  lisinopril-hydrochlorothiazide (PRINZIDE,ZESTORETIC)  10-12.5 MG tablet Take 1 tablet by mouth daily. 10/30/16   [provider]  meloxicam (MOBIC) 7.5 MG tablet meloxicam 7.5 mg tablet  TAKE 1 TAB BY MOUTH DAILY WITH FOOD 03/04/17   [provider]  montelukast  (SINGULAIR ) 10 MG tablet Take 1 tablet (10 mg total) by mouth at bedtime. 02/14/23   Cari Arlean HERO, FNP  ondansetron  (ZOFRAN -ODT) 8 MG disintegrating tablet Take 1 tablet (8 mg total) by mouth every 8 (eight) hours as needed for nausea or vomiting. 06/12/22   Iva Marty Saltness, MD  pantoprazole  (PROTONIX ) 40 MG tablet Take 1 tablet (40 mg total) by mouth daily. 07/28/24   Cheryl Reusing, FNP  RESTASIS 0.05 % ophthalmic emulsion 1 drop 2 (two) times daily. 09/20/22   [provider]  Spacer/Aero-Holding Chambers (AEROCHAMBER MV) inhaler Use as instructed 02/07/24   Luke Orlan HERO, DO  VENTOLIN  HFA 108 (90 Base) MCG/ACT inhaler Inhale 2 puffs into the lungs every 4 (four) hours as needed for wheezing or shortness of breath. 08/03/24   Cheryl Reusing, FNP  ZETIA 10 MG tablet Take 10 mg by mouth daily. 07/01/17   [provider]    Family History Family History  Problem Relation Age of Onset   Hypertension Mother    High Cholesterol Mother    Pneumonia Father    Liver disease Father    Hypertension Other    Stroke Other    High Cholesterol Other    Breast cancer Maternal Aunt    Pancreatic cancer Maternal Uncle    Allergic rhinitis Neg Hx    Angioedema Neg Hx    Asthma Neg Hx    Eczema Neg Hx    Immunodeficiency Neg Hx    Urticaria Neg Hx     Social History Social History[1]   Allergies   Pistachio nut (diagnostic), Plum pulp, Prunus persica, Strawberry extract, Aspirin, Fruit & vegetable daily [nutritional supplements], Methocarbamol, Morphine and codeine , Percocet [oxycodone-acetaminophen ], Shrimp [shellfish allergy], Simvastatin, Banana, Methocarbamol, Morphine, Oxycodone-acetaminophen , and Saccharin   Review of Systems Review of Systems   Constitutional:        Rabies vaccine   Musculoskeletal:        Left finger pain     Physical Exam Triage Vital Signs ED Triage Vitals  Encounter Vitals Group     BP 08/24/24 1913 (!) 142/89     Girls Systolic BP Percentile --      Girls Diastolic BP Percentile --      Boys Systolic BP Percentile --      Boys  Diastolic BP Percentile --      Pulse Rate 08/24/24 1913 67     Resp 08/24/24 1913 16     Temp 08/24/24 1913 98.5 F (36.9 C)     Temp Source 08/24/24 1913 Oral     SpO2 08/24/24 1913 96 %     Weight 08/24/24 1914 140 lb (63.5 kg)     Height 08/24/24 1914 5' (1.524 m)     Head Circumference --      Peak Flow --      Pain Score 08/24/24 1914 0     Pain Loc --      Pain Education --      Exclude from Growth Chart --    No data found.  Updated Vital Signs BP (!) 142/89 (BP Location: Right Arm)   Pulse 67   Temp 98.5 F (36.9 C) (Oral)   Resp 16   Ht 5' (1.524 m)   Wt 140 lb (63.5 kg)   LMP 08/18/2024 (Exact Date)   SpO2 96%   BMI 27.34 kg/m   Visual Acuity Right Eye Distance:   Left Eye Distance:   Bilateral Distance:    Right Eye Near:   Left Eye Near:    Bilateral Near:     Physical Exam Vitals and nursing note reviewed.  Constitutional:      Appearance: Normal appearance.  HENT:     Head: Normocephalic and atraumatic.  Eyes:     Pupils: Pupils are equal, round, and reactive to light.  Cardiovascular:     Rate and Rhythm: Normal rate.  Pulmonary:     Effort: Pulmonary effort is normal.  Musculoskeletal:     Comments: There is minimal swelling of the mid left index finger with pain to the PIP joint.  Cap refill +2.  No bruising.  Full range of motion with pain on flexion.  Skin:    General: Skin is warm and dry.     Comments: Superficial scratch to left forearm without erythema warmth or drainage  Neurological:     General: No focal deficit present.     Mental Status: She is alert and oriented to person, place, and time.  Psychiatric:         Mood and Affect: Mood normal.        Behavior: Behavior normal.      UC Treatments / Results  Labs (all labs ordered are listed, but only abnormal results are displayed) Labs Reviewed - No data to display  EKG   Radiology No results found.  Procedures Procedures (including critical care time)  Medications Ordered in UC Medications  rabies vaccine , human diploid (IMOVAX) injection 1 mL (1 mL Intramuscular Given 08/24/24 1930)    Initial Impression / Assessment and Plan / UC Course  I have reviewed the triage vital signs and the nursing notes.  Pertinent labs & imaging results that were available during my care of the patient were reviewed by me and considered in my medical decision making (see chart for details).     Reviewed x-rays with patient.  Wet read without obvious fracture, will contact for any positive results based on radiology overread once available.  Finger splint applied for patient request for comfort.  Discussed RICE therapy.  OTC analgesics as needed.  Second rabies vaccine  administered.  Patient to return on day 7 and 14 for additional vaccines.  PCP follow-up if symptoms do not improve.  ER precautions reviewed. Final Clinical Impressions(s) / UC Diagnoses  Final diagnoses:  Cat scratch  Finger pain, left     Discharge Instructions      You may use a finger splint to help support your index finger.  Elevate and ice as needed.  Take Tylenol  or ibuprofen  for pain.  Return to clinic on day 7 and 14 for additional vaccines.  Follow-up with your PCP if your symptoms do not improve.  Please go to the ER for any worsening symptoms.  Hope you feel better soon!     ED Prescriptions   None    PDMP not reviewed this encounter.    Loreda Myla SAUNDERS, NP 08/24/24 1923     [1]  Social History Tobacco Use   Smoking status: Never   Smokeless tobacco: Never  Vaping Use   Vaping status: Never Used  Substance Use Topics   Alcohol use: No   Drug use:  No     Loreda Myla SAUNDERS, NP 08/24/24 1947  "

## 2024-08-24 NOTE — Discharge Instructions (Addendum)
 You may use a finger splint to help support your index finger.  Elevate and ice as needed.  Take Tylenol  or ibuprofen  for pain.  Return to clinic on day 7 and 14 for additional vaccines.  Follow-up with your PCP if your symptoms do not improve.  Please go to the ER for any worsening symptoms.  Hope you feel better soon!

## 2024-08-25 ENCOUNTER — Ambulatory Visit: Payer: Self-pay | Admitting: Nurse Practitioner

## 2024-08-28 ENCOUNTER — Ambulatory Visit
Admission: EM | Admit: 2024-08-28 | Discharge: 2024-08-28 | Disposition: A | Discharge status: Home / Self Care | Attending: Nurse Practitioner

## 2024-08-28 DIAGNOSIS — Z23 Encounter for immunization: Secondary | ICD-10-CM | POA: Diagnosis not present

## 2024-08-28 MED ORDER — RABIES VIRUS VACCINE, HDC IM SUSR
1.0000 mL | Freq: Once | INTRAMUSCULAR | Status: AC
  Administered 2024-08-28: 1 mL via INTRAMUSCULAR

## 2024-08-28 NOTE — ED Triage Notes (Signed)
PT here for 3rd rabies injection.

## 2024-09-04 ENCOUNTER — Ambulatory Visit
Admission: EM | Admit: 2024-09-04 | Discharge: 2024-09-04 | Disposition: A | Discharge status: Home / Self Care | Attending: Urgent Care | Admitting: Urgent Care

## 2024-09-04 DIAGNOSIS — Z23 Encounter for immunization: Secondary | ICD-10-CM

## 2024-09-04 MED ORDER — RABIES VACCINE, PCEC IM SUSR
1.0000 mL | Freq: Once | INTRAMUSCULAR | Status: AC
  Administered 2024-09-04: 1 mL via INTRAMUSCULAR

## 2024-09-04 NOTE — ED Triage Notes (Signed)
 Pt for 4th rabies vaccine -denies issues-NAD-steady gait

## 2024-09-18 ENCOUNTER — Ambulatory Visit

## 2024-09-18 ENCOUNTER — Ambulatory Visit
Admission: RE | Admit: 2024-09-18 | Discharge: 2024-09-18 | Disposition: A | Discharge status: Home / Self Care | Payer: Self-pay | Source: Ambulatory Visit | Attending: Family Medicine

## 2024-09-18 ENCOUNTER — Encounter

## 2024-09-18 VITALS — BP 137/93 | HR 79 | Temp 98.3°F | Resp 16

## 2024-09-18 DIAGNOSIS — S8002XA Contusion of left knee, initial encounter: Secondary | ICD-10-CM | POA: Diagnosis not present

## 2024-09-18 DIAGNOSIS — S8012XA Contusion of left lower leg, initial encounter: Secondary | ICD-10-CM

## 2024-09-18 DIAGNOSIS — S80212A Abrasion, left knee, initial encounter: Secondary | ICD-10-CM

## 2024-09-18 MED ORDER — IBUPROFEN 600 MG PO TABS: 600.0000 mg | ORAL_TABLET | Freq: Four times a day (QID) | ORAL | 0 refills | Status: AC | PRN

## 2024-09-18 MED ORDER — BACITRACIN ZINC 500 UNIT/GM EX OINT: 1.0000 | TOPICAL_OINTMENT | Freq: Two times a day (BID) | CUTANEOUS | 0 refills | Status: AC

## 2024-09-18 NOTE — ED Triage Notes (Signed)
 Pt presents for the lats Rabies vaccine .   Pt reports pain in the left knee x 1 week after she fell in her house when she was rushing trying to take the trash bags out of the door.

## 2024-09-18 NOTE — Progress Notes (Signed)
 Patient came in to the office to pick up her Dupixent  300mg /ml injection

## 2024-09-18 NOTE — ED Notes (Signed)
 Pt reports she needs Rabies vaccine  #5. Pt has all the Rabies vaccines as told at the ED, on days 08/24/24, 08/28/24 and 09/04/24.

## 2024-09-18 NOTE — Discharge Instructions (Addendum)
 I have taken the liberty of printing your visit summary from your ER visit.  This confirms today's schedule that was recommended initially.  Today we will not be administering fifth dose of rabies vaccine .  This is not indicated as you did not meet criteria for an immunocompromised condition.  He can follow-up with your PCP if you would like them to reconsider this.    At your request, providing you with the phone number for you to call regarding your concerns with today's visit.  [phone]  You have a left knee contusion.  This will take some time to heal.  I encouraged the use of ibuprofen  for anti-inflammatory properties and pain relief.  Change your dressing 2-3 times daily. Every time you change your dressing, clean the wound gently with warm water and Dial antibacterial soap. Pat the wound dry, let it breathe for roughly an hour before covering it back up. When you reapply a dressing, apply Bacitracin  ointment to the wound, then cover with non-stick/non-adherent gauze.

## 2024-09-18 NOTE — ED Provider Notes (Signed)
 " [facility]  Note:  This document was prepared using Conservation officer, historic buildings and may include unintentional dictation errors.  MRN: [id number] DOB: March 21, 1971  Subjective:   [name] is a 54 y.o. female presenting for 2 chief complaints.   Presents for a 5th rabies vaccine .  Patient has had 4 total doses on 08/21/2024, 08/24/2024, 08/28/2024 and 09/04/2024.  Reports that she is immunosuppressed and needs a 5th injection.  Chart review does not demonstrate that she is on any immunosuppression.  She does take Dupixent  and immune modulator.   Reports 1 week history of persistent left knee pain with an abrasion. Symptoms started after she slipped and fell on her knee.  Has had some scrapes that she has been tending to with Band-Aids, ointments.  She is able to bear weight and walk but feels pain within her knee.  Current Outpatient Medications  Medication Instructions   cetirizine  (ZYRTEC  ALLERGY) 10 mg, Oral, 2 times daily PRN   cromolyn  (OPTICROM ) 4 % ophthalmic solution 1-2 drops, Both Eyes, 4 times daily PRN   cyclobenzaprine (FLEXERIL) 10 mg, At bedtime PRN   DUPIXENT  300 MG/2ML prefilled syringe INJECT THE CONTENTS OF 1 SYRINGE (300 MG) UNDER THE SKIN EVERY 2 WEEKS   EPINEPHrine  (EPI-PEN) 0.3 mg, Intramuscular, As needed   EYSUVIS 0.25 % SUSP Apply to eye.   fluticasone  (FLONASE ) 50 MCG/ACT nasal spray Daily   fluticasone  (FLOVENT  HFA) 110 MCG/ACT inhaler 2 puffs, Inhalation, 2 times daily   gabapentin (NEURONTIN) 600 mg, 2 times daily   hydrocortisone  2.5 % cream Topical, 2 times daily PRN   hydrOXYzine  (ATARAX ) 10 MG tablet Take 1-2 tablets at bedtime as needed for itch   labetalol (NORMODYNE) 200 MG tablet 1 tablet, 2 times daily   lisinopril-hydrochlorothiazide (PRINZIDE,ZESTORETIC) 10-12.5 MG tablet 1 tablet, Daily   meloxicam (MOBIC) 7.5 MG tablet meloxicam 7.5 mg tablet  TAKE 1 TAB BY MOUTH DAILY WITH FOOD   montelukast  (SINGULAIR ) 10  mg, Oral, Daily at bedtime   ondansetron  (ZOFRAN -ODT) 8 mg, Oral, Every 8 hours PRN   pantoprazole  (PROTONIX ) 40 mg, Oral, Daily   RESTASIS 0.05 % ophthalmic emulsion 1 drop, 2 times daily   Spacer/Aero-Holding Chambers (AEROCHAMBER MV) inhaler Use as instructed   VENTOLIN  HFA 108 (90 Base) MCG/ACT inhaler 2 puffs, Inhalation, Every 4 hours PRN   Zetia 10 mg, Daily    Allergies[1]  Past Medical History:  Diagnosis Date   Arthritis    degenerative disc disease-work related injury   Asthma    controlled-last used rescue inhaler 3 months ago   Eczema    Fibroids    GERD (gastroesophageal reflux disease)    zantac /pepcid  prn   Headache(784.0)    High cholesterol    Hypertension    Pre-eclampsia    Pre-eclampsia    Urticaria      Past Surgical History:  Procedure Laterality Date   CESAREAN SECTION  2011   fibroidectomy     SPINAL FUSION  2006   L4-5   WISDOM TOOTH EXTRACTION      Family History  Problem Relation Age of Onset   Hypertension Mother    High Cholesterol Mother    Pneumonia Father    Liver disease Father    Hypertension Other    Stroke Other    High Cholesterol Other    Breast cancer Maternal Aunt    Pancreatic cancer Maternal Uncle    Allergic rhinitis Neg Hx    Angioedema  Neg Hx    Asthma Neg Hx    Eczema Neg Hx    Immunodeficiency Neg Hx    Urticaria Neg Hx     Social History   Occupational History   Not on file  Tobacco Use   Smoking status: Never   Smokeless tobacco: Never  Vaping Use   Vaping status: Never Used  Substance and Sexual Activity   Alcohol use: No   Drug use: No   Sexual activity: Not Currently    Birth control/protection: None     ROS   Objective:   Vitals: BP (!) 137/93 (BP Location: Left Arm)   Pulse 79   Temp 98.3 F (36.8 C) (Oral)   Resp 16   LMP  (Within Months) Comment: 1 month  SpO2 98%   Physical Exam Constitutional:      General: She is not in acute distress.    Appearance: Normal appearance.  She is well-developed. She is not ill-appearing, toxic-appearing or diaphoretic.  HENT:     Head: Normocephalic and atraumatic.     Nose: Nose normal.     Mouth/Throat:     Mouth: Mucous membranes are moist.  Eyes:     General: No scleral icterus.       Right eye: No discharge.        Left eye: No discharge.     Extraocular Movements: Extraocular movements intact.  Cardiovascular:     Rate and Rhythm: Normal rate.  Pulmonary:     Effort: Pulmonary effort is normal.  Musculoskeletal:     Left knee: No swelling, deformity, effusion, erythema, ecchymosis, lacerations, bony tenderness or crepitus. Normal range of motion. Tenderness (over multiple resolving abrasions anteriorly) present. No medial joint line, lateral joint line or patellar tendon tenderness. Normal alignment and normal patellar mobility.  Skin:    General: Skin is warm and dry.  Neurological:     General: No focal deficit present.     Mental Status: She is alert and oriented to person, place, and time.  Psychiatric:        Mood and Affect: Mood normal.        Behavior: Behavior normal.    A dressing was placed over the abrasions of the anterior left knee using bacitracin , nonadherent and Coban.  For structural support and compression relief, an Ace wrap was applied to the left knee.  Assessment and Plan :   PDMP not reviewed this encounter.  1. Abrasion of left knee, initial encounter   2. Contusion of knee and lower leg, left, initial encounter      Had extensive discussion with patient about rabies vaccination.  She reports that she had paperwork stating she needed a fifth rabies vaccine  today.  Reports that she had this report from the emergency room.  I confirmed with the patient multiple times demonstrating and printing her AVS summary but also the assessment and plan from her ER visit.  There is no indication for fifth rabies vaccination as she is not immunocompromised.  Patient requested information to file a  complaint for this.  I provided her with this.  Otherwise, provided information about general management of her abrasion.  Low suspicion for any fracture, deferred imaging.  Patient was in agreement as her primary focus was getting fifth rabies vaccination dose.     [1]  Allergies Allergen Reactions   Pistachio Nut (Diagnostic) Other (See Comments)    As well as pistachio pudding   Plum Pulp Itching and Swelling  Prunus Persica Itching and Swelling   Strawberry Extract Itching and Swelling   Aspirin Other (See Comments)    Fever; Pt can take ibuprofen  without problems.   Fruit & Vegetable Daily [Nutritional Supplements] Other (See Comments)    ALL FRUIT WITH SEEDS- THROAT ITCHING   Methocarbamol Nausea Only and Other (See Comments)    sleepy   Morphine And Codeine  Itching   Percocet [Oxycodone-Acetaminophen ] Itching and Nausea Only    Pt can take plain Tylenol  and Tylenol  with codeine .   Shrimp [Shellfish Allergy] Itching    THROAT ITCHING   Simvastatin Other (See Comments)    Body aches   Banana Nausea Only   Methocarbamol    Morphine    Oxycodone-Acetaminophen     Saccharin Nausea Only     Christopher Savannah, [place] 09/18/24 1953  "
